# Patient Record
Sex: Female | Born: 2005 | Race: Black or African American | Hispanic: No | Marital: Single | Smoking: Never smoker
Health system: Southern US, Community
[De-identification: ages and names within clinical notes are randomized; demographics above are authoritative.]

## PROBLEM LIST (undated history)

## (undated) DIAGNOSIS — I499 Cardiac arrhythmia, unspecified: Secondary | ICD-10-CM

## (undated) HISTORY — PX: TONSILLECTOMY: SUR1361

---
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
## 2023-01-10 ENCOUNTER — Ambulatory Visit (INDEPENDENT_AMBULATORY_CARE_PROVIDER_SITE_OTHER): Payer: Medicaid Other | Admitting: Obstetrics & Gynecology

## 2023-01-10 ENCOUNTER — Encounter: Payer: Self-pay | Admitting: Obstetrics & Gynecology

## 2023-01-10 VITALS — BP 118/68 | HR 97 | Ht 62.0 in | Wt 128.0 lb

## 2023-01-10 DIAGNOSIS — N83201 Unspecified ovarian cyst, right side: Secondary | ICD-10-CM

## 2023-01-10 DIAGNOSIS — Z3046 Encounter for surveillance of implantable subdermal contraceptive: Secondary | ICD-10-CM

## 2023-01-10 DIAGNOSIS — Z30011 Encounter for initial prescription of contraceptive pills: Secondary | ICD-10-CM

## 2023-01-10 MED ORDER — DROSPIRENONE-ETHINYL ESTRADIOL 3-0.02 MG PO TABS
1.0000 | ORAL_TABLET | Freq: Every day | ORAL | 11 refills | Status: DC
Start: 2023-01-10 — End: 2024-05-06

## 2023-01-10 NOTE — Patient Instructions (Signed)
Nexplanon Instructions After Removal  Keep bandage clean and dry for 24 hours  May use ice/Tylenol/Ibuprofen for soreness or pain  If you develop fever, drainage or increased warmth from incision site-contact office immediately   

## 2023-01-10 NOTE — Progress Notes (Signed)
GYNECOLOGY OFFICE VISIT NOTE  History:   Teresa Lucero is a 17 y.o. G1P0 here today for evaluation after ED visit on 12/10/22 for ruptured hemorrhagic cyst.  2 cm right ovarian hemorrhagic cyst seen on imaging.  She is here with her mother.  No concerns about cyst, but wants Nexplanon removed. This was placed in 2022, she still has prolonged bleeding.  Wants to start OCPs to help with her menses and also help with ovarian cyst suppression. She denies any abnormal vaginal discharge,  pelvic pain or other concerns.    Past Medical History:  Diagnosis Date   Irregular heart beat     Past Surgical History:  Procedure Laterality Date   TONSILLECTOMY      The following portions of the patient's history were reviewed and updated as appropriate: allergies, current medications, past family history, past medical history, past social history, past surgical history and problem list.   Health Maintenance:  Never had HPV vaccine series  Review of Systems:  Pertinent items noted in HPI and remainder of comprehensive ROS otherwise negative.  Physical Exam:  BP 118/68   Pulse 97   Ht 5\' 2"  (1.575 m)   Wt 128 lb (58.1 kg)   LMP 11/28/2022 (Exact Date)   BMI 23.41 kg/m  CONSTITUTIONAL: Well-developed, well-nourished female in no acute distress.  HEENT:  Normocephalic, atraumatic. External right and left ear normal. No scleral icterus.  NECK: Normal range of motion, supple, no masses noted on observation SKIN: No rash noted. Not diaphoretic. No erythema. No pallor. MUSCULOSKELETAL: Normal range of motion. No edema noted. NEUROLOGIC: Alert and oriented to person, place, and time. Normal muscle tone coordination. No cranial nerve deficit noted. PSYCHIATRIC: Normal mood and affect. Normal behavior. Normal judgment and thought content. CARDIOVASCULAR: Normal heart rate noted RESPIRATORY: Effort and breath sounds normal, no problems with respiration noted ABDOMEN: No masses noted. No other overt  distention noted.   PELVIC: Deferred  Nexplanon Removal Patient identified, informed consent performed, consent signed.   Appropriate time out taken. Nexplanon site identified.  Area prepped in usual sterile fashon. One ml of 1% lidocaine was used to anesthetize the area at the distal end of the implant. A small stab incision was made right beside the implant on the distal portion.  The Nexplanon rod was grasped using hemostats and removed without difficulty.  There was minimal blood loss. There were no complications.  3 ml of 1% lidocaine was injected around the incision for post-procedure analgesia.  Steri-strips were applied over the small incision.  A pressure bandage was applied to reduce any bruising.  The patient tolerated the procedure well and was given post procedure instructions.  Patient is planning to use OCPs for contraception.  Imaging: US PELVIC COMPLETE W TRANSVAGINAL AND TORSION R/O  Result Date: 12/10/2022 CLINICAL DATA:  Pelvic pain EXAM: TRANSABDOMINAL AND TRANSVAGINAL ULTRASOUND OF PELVIS DOPPLER ULTRASOUND OF OVARIES TECHNIQUE: Both transabdominal and transvaginal ultrasound examinations of the pelvis were performed. Transabdominal technique was performed for global imaging of the pelvis including uterus, ovaries, adnexal regions, and pelvic cul-de-sac. It was necessary to proceed with endovaginal exam following the transabdominal exam to visualize the ovaries and endometrium. Color and duplex Doppler ultrasound was utilized to evaluate blood flow to the ovaries. COMPARISON:  CT abdomen and pelvis 12/10/2022 FINDINGS: Uterus Measurements: 5.7 x 4.2 x 4.3 cm = volume: 53 mL. Diffusely heterogeneous. Endometrium Thickness: 3.1 mm.  No focal abnormality visualized. Right ovary Measurements: 4.1 x 3.0 x 2.9 cm =  volume: 19 mL. Complex cyst with internal debris identified measuring 2.1 x 1.2 x 1.4 cm. Left ovary Measurements: 4.0 cm by 2.9 cm x 2.3 cm = volume: 14 mL. Normal appearance/no  adnexal mass. Pulsed Doppler evaluation of both ovaries demonstrates normal low-resistance arterial and venous waveforms. Other findings There is a moderate amount of free fluid within the pelvis. There is echogenic material within the cul-de-sac with internal vascular flow, indeterminate, measuring 8 x 2.1 x 1.8 cm. Unclear correlative finding on recent CT. IMPRESSION: 1. Moderate amount of free fluid within the pelvis. Echogenic material within the cul-de-sac with internal vascular flow, indeterminate, measuring 8 x 2.1 x 1.8 cm. Unclear correlative finding on recent CT. Differential diagnosis would include pedunculated fibroid, endometriosis implant, or other etiologies. 2. Complex cyst within the right ovary measuring 2.1 x 1.2 x 1.4 cm, likely hemorrhagic. No follow-up necessary. 3. No evidence of ovarian torsion. Pelvic pain please select correct "US Pelvis" template depending on combination of exams performed / being read (e.g. both, Doppler, etc). Electronically Signed   By: Darliss Cheney M.D.   On: 12/10/2022 15:30   CT ABDOMEN PELVIS W CONTRAST  Result Date: 12/10/2022 CLINICAL DATA:  Abdominal pain, acute, nonlocalized EXAM: CT ABDOMEN AND PELVIS WITH CONTRAST TECHNIQUE: Multidetector CT imaging of the abdomen and pelvis was performed using the standard protocol following bolus administration of intravenous contrast. RADIATION DOSE REDUCTION: This exam was performed according to the departmental dose-optimization program which includes automated exposure control, adjustment of the mA and/or kV according to patient size and/or use of iterative reconstruction technique. CONTRAST:  80mL OMNIPAQUE IOHEXOL 300 MG/ML  SOLN COMPARISON:  None Available. FINDINGS: Lower chest: No acute abnormality. Hepatobiliary: No suspicious focal lesion identified. Scattered benign cysts. Gallbladder is unremarkable. No intrahepatic or extrahepatic biliary ductal dilation. Portal vein is patent. Pancreas: Unremarkable. No  pancreatic ductal dilatation or surrounding inflammatory changes. Spleen: Normal in size without focal abnormality. Adrenals/Urinary Tract: Adrenal glands are unremarkable. Kidneys enhance symmetrically. No hydronephrosis. No obstructing nephrolithiasis. Bladder is completely decompressed, limiting evaluation. Stomach/Bowel: No evidence of bowel obstruction. Visualized portions of the appendiceal candidate are unremarkable. Portions are obscured by multiple closely adjacent bowel loops and lack of oral contrast. Stomach is unremarkable. Vascular/Lymphatic: No significant vascular findings are present. No enlarged abdominal or pelvic lymph nodes. Reproductive: Uterus and bilateral adnexa are unremarkable. Other: Small volume of free fluid in the pelvis. Musculoskeletal: LEFT-sided assimilation joint at L5-S1. IMPRESSION: 1. Small volume of free fluid in the pelvis is nonspecific and is favored physiologic. This could be due to rupture of an ovarian cyst. 2. Visualized portions of the appendiceal candidate are unremarkable. Portions are obscured by multiple closely adjacent bowel loops and lack of oral contrast. If persistent clinical concern for acute appendicitis, consider follow-up imaging. Electronically Signed   By: Meda Klinefelter M.D.   On: 12/10/2022 12:14       Assessment and Plan:    1. Cyst of right ovary Reviewed imaging, reassured patient about benign, physiologic nature of ovarian cyst. Will start on OCPs to help with suppression.  2. Initiation of OCP (BCP) Risks and benefits discussed in detail, all questions answered. Stressed that Nexplanon had more contraceptive efficacy, but patient desired OCPs. Will start Yaz today. Backup contraception recommended for first two weeks, condoms recommended for STI prevention. Wil do OCP check in about 2 months, told to call earlier for any concerns. - drospirenone-ethinyl estradiol (YAZ) 3-0.02 MG tablet; Take 1 tablet by mouth daily.  Dispense: 28  tablet; Refill: 11  3. Encounter for Nexplanon removal [Z30.46] This was successfully removed, OCPs started. \  Routine preventative health maintenance measures emphasized, counseled her and her mother about getting HPV vaccine.  They will consider this and let us know.  Please refer to After Visit Summary for other counseling recommendations.   Return in about 7 weeks (around 02/28/2023) for OCP check.    I spent 30 minutes dedicated to the care of this patient including pre-visit review of records, face to face time with the patient discussing her conditions and treatments and post visit orders.    Jaynie Collins, MD, FACOG Obstetrician & Gynecologist, [facility] for [employer], [facility]

## 2023-02-26 ENCOUNTER — Ambulatory Visit: Payer: Medicaid Other | Admitting: Obstetrics and Gynecology

## 2024-05-06 ENCOUNTER — Encounter (HOSPITAL_BASED_OUTPATIENT_CLINIC_OR_DEPARTMENT_OTHER): Payer: Self-pay | Admitting: Emergency Medicine

## 2024-05-06 ENCOUNTER — Encounter (HOSPITAL_COMMUNITY): Payer: Self-pay | Admitting: Psychiatry

## 2024-05-06 ENCOUNTER — Emergency Department (HOSPITAL_BASED_OUTPATIENT_CLINIC_OR_DEPARTMENT_OTHER)

## 2024-05-06 ENCOUNTER — Inpatient Hospital Stay (HOSPITAL_COMMUNITY)
Admission: AD | Admit: 2024-05-06 | Discharge: 2024-05-10 | DRG: 885 | Disposition: A | Discharge status: Home / Self Care | Source: Intra-hospital

## 2024-05-06 ENCOUNTER — Other Ambulatory Visit: Payer: Self-pay

## 2024-05-06 ENCOUNTER — Emergency Department (INDEPENDENT_AMBULATORY_CARE_PROVIDER_SITE_OTHER)
Admission: EM | Admit: 2024-05-06 | Discharge: 2024-05-06 | Disposition: A | Discharge status: Other Acute Inpatient Hospital | Source: Home / Self Care

## 2024-05-06 DIAGNOSIS — R011 Cardiac murmur, unspecified: Secondary | ICD-10-CM | POA: Insufficient documentation

## 2024-05-06 DIAGNOSIS — Z62810 Personal history of physical and sexual abuse in childhood: Secondary | ICD-10-CM

## 2024-05-06 DIAGNOSIS — Z9152 Personal history of nonsuicidal self-harm: Secondary | ICD-10-CM | POA: Diagnosis not present

## 2024-05-06 DIAGNOSIS — F32A Depression, unspecified: Secondary | ICD-10-CM | POA: Insufficient documentation

## 2024-05-06 DIAGNOSIS — F4310 Post-traumatic stress disorder, unspecified: Secondary | ICD-10-CM | POA: Diagnosis present

## 2024-05-06 DIAGNOSIS — R45851 Suicidal ideations: Secondary | ICD-10-CM | POA: Diagnosis present

## 2024-05-06 DIAGNOSIS — F332 Major depressive disorder, recurrent severe without psychotic features: Secondary | ICD-10-CM

## 2024-05-06 DIAGNOSIS — Z79899 Other long term (current) drug therapy: Secondary | ICD-10-CM | POA: Diagnosis not present

## 2024-05-06 DIAGNOSIS — D649 Anemia, unspecified: Secondary | ICD-10-CM | POA: Diagnosis present

## 2024-05-06 DIAGNOSIS — E559 Vitamin D deficiency, unspecified: Secondary | ICD-10-CM | POA: Diagnosis present

## 2024-05-06 DIAGNOSIS — G4700 Insomnia, unspecified: Secondary | ICD-10-CM | POA: Diagnosis present

## 2024-05-06 DIAGNOSIS — Z91410 Personal history of adult physical and sexual abuse: Secondary | ICD-10-CM | POA: Diagnosis not present

## 2024-05-06 DIAGNOSIS — F411 Generalized anxiety disorder: Secondary | ICD-10-CM | POA: Diagnosis present

## 2024-05-06 DIAGNOSIS — Z639 Problem related to primary support group, unspecified: Secondary | ICD-10-CM

## 2024-05-06 DIAGNOSIS — Z8249 Family history of ischemic heart disease and other diseases of the circulatory system: Secondary | ICD-10-CM

## 2024-05-06 DIAGNOSIS — F410 Panic disorder [episodic paroxysmal anxiety] without agoraphobia: Secondary | ICD-10-CM | POA: Diagnosis present

## 2024-05-06 DIAGNOSIS — R0789 Other chest pain: Secondary | ICD-10-CM | POA: Insufficient documentation

## 2024-05-06 DIAGNOSIS — Z9151 Personal history of suicidal behavior: Secondary | ICD-10-CM

## 2024-05-06 DIAGNOSIS — Z91411 Personal history of adult psychological abuse: Secondary | ICD-10-CM | POA: Diagnosis not present

## 2024-05-06 LAB — COMPREHENSIVE METABOLIC PANEL WITH GFR
ALT: 14 U/L (ref 0–44)
AST: 17 U/L (ref 15–41)
Albumin: 4.5 g/dL (ref 3.5–5.0)
Alkaline Phosphatase: 52 U/L (ref 38–126)
Anion gap: 13 (ref 5–15)
BUN: 12 mg/dL (ref 6–20)
CO2: 23 mmol/L (ref 22–32)
Calcium: 9.5 mg/dL (ref 8.9–10.3)
Chloride: 103 mmol/L (ref 98–111)
Creatinine, Ser: 0.68 mg/dL (ref 0.44–1.00)
GFR, Estimated: >60 mL/min (ref >60)
Glucose, Bld: 90 mg/dL (ref 70–99)
Potassium: 3.4 mmol/L — ABNORMAL LOW (ref 3.5–5.1)
Sodium: 140 mmol/L (ref 135–145)
Total Bilirubin: 0.3 mg/dL (ref 0.0–1.2)
Total Protein: 7.6 g/dL (ref 6.5–8.1)

## 2024-05-06 LAB — CBC WITH DIFFERENTIAL/PLATELET
Abs Immature Granulocytes: 0.03 K/uL (ref 0.00–0.07)
Basophils Absolute: 0 K/uL (ref 0.0–0.1)
Basophils Relative: 0 %
Eosinophils Absolute: 0 K/uL (ref 0.0–0.5)
Eosinophils Relative: 0 %
HCT: 26.5 % — ABNORMAL LOW (ref 36.0–46.0)
Hemoglobin: 7.3 g/dL — ABNORMAL LOW (ref 12.0–15.0)
Immature Granulocytes: 0 %
Lymphocytes Relative: 13 %
Lymphs Abs: 1.4 K/uL (ref 0.7–4.0)
MCH: 15 pg — ABNORMAL LOW (ref 26.0–34.0)
MCHC: 27.5 g/dL — ABNORMAL LOW (ref 30.0–36.0)
MCV: 54.5 fL — ABNORMAL LOW (ref 80.0–100.0)
Monocytes Absolute: 1.2 K/uL — ABNORMAL HIGH (ref 0.1–1.0)
Monocytes Relative: 11 %
Neutro Abs: 7.7 K/uL (ref 1.7–7.7)
Neutrophils Relative %: 76 %
Platelets: 503 K/uL — ABNORMAL HIGH (ref 150–400)
RBC: 4.86 MIL/uL (ref 3.87–5.11)
RDW: 22.5 % — ABNORMAL HIGH (ref 11.5–15.5)
WBC: 10.3 K/uL (ref 4.0–10.5)
nRBC: 0 % (ref 0.0–0.2)

## 2024-05-06 LAB — TROPONIN T, HIGH SENSITIVITY
Troponin T High Sensitivity: <15 ng/L (ref 0–19)
Troponin T High Sensitivity: <15 ng/L (ref 0–19)

## 2024-05-06 LAB — HCG, SERUM, QUALITATIVE: Preg, Serum: NEGATIVE

## 2024-05-06 LAB — URINE DRUG SCREEN
Amphetamines: NEGATIVE
Barbiturates: NEGATIVE
Benzodiazepines: NEGATIVE
Cocaine: NEGATIVE
Fentanyl: NEGATIVE
Methadone Scn, Ur: NEGATIVE
Opiates: NEGATIVE
Tetrahydrocannabinol: POSITIVE — AB

## 2024-05-06 LAB — D-DIMER, QUANTITATIVE: D-Dimer, Quant: 0.67 ug{FEU}/mL — ABNORMAL HIGH (ref 0.00–0.50)

## 2024-05-06 LAB — ETHANOL: Alcohol, Ethyl (B): <15 mg/dL (ref <15)

## 2024-05-06 MED ORDER — LORAZEPAM 2 MG/ML IJ SOLN: 2.0000 mg | Freq: Three times a day (TID) | INTRAMUSCULAR | Status: DC | PRN

## 2024-05-06 MED ORDER — HALOPERIDOL LACTATE 5 MG/ML IJ SOLN: 10.0000 mg | Freq: Three times a day (TID) | INTRAMUSCULAR | Status: DC | PRN

## 2024-05-06 MED ORDER — MAGNESIUM HYDROXIDE 400 MG/5ML PO SUSP: 30.0000 mL | Freq: Every day | ORAL | Status: DC | PRN

## 2024-05-06 MED ORDER — DROSPIRENONE-ETHINYL ESTRADIOL 3-0.02 MG PO TABS
1.0000 | ORAL_TABLET | Freq: Every day | ORAL | Status: DC
Start: 2024-05-06 — End: 2024-05-07

## 2024-05-06 MED ORDER — LORAZEPAM 1 MG PO TABS
0.5000 mg | ORAL_TABLET | Freq: Once | ORAL | Status: AC
  Administered 2024-05-06: 0.5 mg via ORAL
  Filled 2024-05-06: qty 1

## 2024-05-06 MED ORDER — HYDROXYZINE HCL 25 MG PO TABS
25.0000 mg | ORAL_TABLET | Freq: Three times a day (TID) | ORAL | Status: DC | PRN
  Administered 2024-05-06 – 2024-05-10 (×3): 25 mg via ORAL
  Filled 2024-05-06 (×3): qty 1

## 2024-05-06 MED ORDER — DIPHENHYDRAMINE HCL 25 MG PO CAPS: 50.0000 mg | ORAL_CAPSULE | Freq: Three times a day (TID) | ORAL | Status: DC | PRN

## 2024-05-06 MED ORDER — DIPHENHYDRAMINE HCL 50 MG/ML IJ SOLN: 50.0000 mg | Freq: Three times a day (TID) | INTRAMUSCULAR | Status: DC | PRN

## 2024-05-06 MED ORDER — HALOPERIDOL LACTATE 5 MG/ML IJ SOLN: 5.0000 mg | Freq: Three times a day (TID) | INTRAMUSCULAR | Status: DC | PRN

## 2024-05-06 MED ORDER — HALOPERIDOL 5 MG PO TABS: 5.0000 mg | ORAL_TABLET | Freq: Three times a day (TID) | ORAL | Status: DC | PRN

## 2024-05-06 MED ORDER — TRAZODONE HCL 50 MG PO TABS
50.0000 mg | ORAL_TABLET | Freq: Every evening | ORAL | Status: DC | PRN
  Administered 2024-05-06 – 2024-05-09 (×4): 50 mg via ORAL
  Filled 2024-05-06 (×4): qty 1

## 2024-05-06 MED ORDER — ACETAMINOPHEN 325 MG PO TABS
650.0000 mg | ORAL_TABLET | Freq: Four times a day (QID) | ORAL | Status: DC | PRN
  Administered 2024-05-06: 650 mg via ORAL
  Filled 2024-05-06: qty 2

## 2024-05-06 MED ORDER — HYDROXYZINE HCL 25 MG PO TABS
25.0000 mg | ORAL_TABLET | Freq: Three times a day (TID) | ORAL | Status: DC | PRN
  Administered 2024-05-06: 25 mg via ORAL
  Filled 2024-05-06: qty 1

## 2024-05-06 MED ORDER — ALUM & MAG HYDROXIDE-SIMETH 200-200-20 MG/5ML PO SUSP: 30.0000 mL | ORAL | Status: DC | PRN

## 2024-05-06 MED ORDER — IOHEXOL 350 MG/ML SOLN
100.0000 mL | Freq: Once | INTRAVENOUS | Status: AC | PRN
  Administered 2024-05-06: 80 mL via INTRAVENOUS

## 2024-05-06 NOTE — ED Provider Notes (Signed)
 [place] [facility] Provider Note   CSN: [id number] Arrival date & time: 05/06/24  0543     Patient presents with: Chest Pain and Suicidal   Teresa Lucero is a 18 y.o. female.   The history is provided by the patient.  Chest Pain Teresa Lucero is a 18 y.o. female who presents to the Emergency Department complaining of chest pain. She presents the emergency department for evaluation of left sided chest pain that is described as constant in nature with an occasional sharp in shooting feeling. Symptoms began yesterday. No associated fever, cough, pain on breathing, leg swelling or pain. Nothing alleviates or worsened her pain. She does feel like it's hard to breathe at time. She also reports suicidal ideation. She is unwilling to provide more details about this. She does report having severe depression and being under a lot of stress. No personal or family history of blood clots. No tobacco, alcohol, drug use. Mother has a history of coronary artery disease in her 4s.       Prior to Admission medications   Medication Sig Start Date End Date Taking? Authorizing Provider  drospirenone -ethinyl estradiol  (YAZ) 3-0.02 MG tablet Take 1 tablet by mouth daily. 01/10/23   Anyanwu, Ugonna A, MD  ferrous sulfate  325 (65 FE) MG tablet Take 1 tablet (325 mg total) by mouth daily. Patient not taking: Reported on 02/01/2021 08/09/18   Angelena Smalls, MD  naproxen  (NAPROSYN ) 375 MG tablet Take 1 tablet (375 mg total) by mouth 2 (two) times daily with a meal. Patient not taking: Reported on 01/10/2023 12/10/22   Harris, Abigail, PA-C  ondansetron  (ZOFRAN  ODT) 4 MG disintegrating tablet Take 1 tablet (4 mg total) by mouth every 8 (eight) hours as needed for nausea or vomiting. Patient not taking: Reported on 02/01/2021 02/21/19   Griselda Norris, MD  traMADol  (ULTRAM ) 50 MG tablet Take 1 tablet (50 mg total) by mouth every 6 (six) hours as needed. Patient not taking:  Reported on 01/10/2023 12/10/22   Harris, Abigail, PA-C    Allergies: Patient has no known allergies.    Review of Systems  Cardiovascular:  Positive for chest pain.  All other systems reviewed and are negative.   Updated Vital Signs BP 115/70 (BP Location: Right Arm)   Pulse 90   Temp 98.8 F (37.1 C) (Oral)   Resp 20   Ht 5' 2 (1.575 m)   Wt 54.4 kg   LMP 04/30/2024   SpO2 100%   BMI 21.95 kg/m   Physical Exam Vitals and nursing note reviewed.  Constitutional:      Appearance: She is well-developed.  HENT:     Head: Normocephalic and atraumatic.  Cardiovascular:     Rate and Rhythm: Normal rate and regular rhythm.     Heart sounds: Murmur heard.  Pulmonary:     Effort: Pulmonary effort is normal. No respiratory distress.     Breath sounds: Normal breath sounds.  Abdominal:     Palpations: Abdomen is soft.     Tenderness: There is no abdominal tenderness. There is no guarding or rebound.  Musculoskeletal:        General: No swelling or tenderness.  Skin:    General: Skin is warm and dry.  Neurological:     Mental Status: She is alert and oriented to person, place, and time.  Psychiatric:     Comments: tearful     (all labs ordered are listed, but only abnormal results are displayed)  Labs Reviewed  COMPREHENSIVE METABOLIC PANEL WITH GFR - Abnormal; Notable for the following components:      Result Value   Potassium 3.4 (*)    All other components within normal limits  CBC WITH DIFFERENTIAL/PLATELET - Abnormal; Notable for the following components:   Hemoglobin 7.3 (*)    HCT 26.5 (*)    MCV 54.5 (*)    MCH 15.0 (*)    MCHC 27.5 (*)    RDW 22.5 (*)    Platelets 503 (*)    Monocytes Absolute 1.2 (*)    All other components within normal limits  D-DIMER, QUANTITATIVE - Abnormal; Notable for the following components:   D-Dimer, Quant 0.67 (*)    All other components within normal limits  ETHANOL  HCG, SERUM, QUALITATIVE  URINE DRUG SCREEN  TROPONIN T,  HIGH SENSITIVITY    EKG: EKG Interpretation Date/Time:  Tuesday May 06 2024 05:55:33 EDT Ventricular Rate:  84 PR Interval:  133 QRS Duration:  80 QT Interval:  364 QTC Calculation: 431 R Axis:   79  Text Interpretation: Sinus arrhythmia Confirmed by Griselda Norris [id number] on 05/06/2024 6:12:03 AM  Radiology: No results found.   Procedures   Medications Ordered in the ED  LORazepam  (ATIVAN ) tablet 0.5 mg (0.5 mg Oral Given 05/06/24 9376)                                    Medical Decision Making Amount and/or Complexity of Data Reviewed Labs: ordered. Radiology: ordered.  Risk Prescription drug management.   Patient here for evaluation of left sided chest pain for 24 hours. EKG is not ischemic. She also does report SI on triage. She is tearful on evaluation, refuses to reveal additional details but does report feeling severe depression. She will not comment when asked on suicidality or plan. CBC is significant for anemia. LMP was last month, no hematochezia or melena. Patient does have a history of anemia and is not currently on iron supplements. She does not meet transfusion threshold. She should start iron supplementation regarding her anemia and follow-up with PCP for recheck. Troponin is negative. Presentation is not consistent with ACS, patient with 24 hours a symptoms. Patient care transferred pending CTA to rule out PE. In terms of patient suicidal thoughts TTS consulted with placed.     Final diagnoses:  None    ED Discharge Orders     None          Griselda Norris, MD 05/06/24 0700

## 2024-05-06 NOTE — Tx Team (Signed)
 Initial Treatment Plan 05/06/2024 2:58 PM Sherrilee Ned FMW:[id number]    PATIENT STRESSORS: Loss of father     PATIENT STRENGTHS: Ability for insight  Capable of independent living  Physical Health  Supportive family/friends  Work skills    PATIENT IDENTIFIED PROBLEMS:   Anger   SI  Depression  Anxiety             DISCHARGE CRITERIA:  Ability to meet basic life and health needs Improved stabilization in mood, thinking, and/or behavior Verbal commitment to aftercare and medication compliance  PRELIMINARY DISCHARGE PLAN: Return to previous living arrangement  PATIENT/FAMILY INVOLVEMENT: This treatment plan has been presented to and reviewed with the patient, Teresa Lucero,  The patient and family have been given the opportunity to ask questions and make suggestions.  Eleanor KATHEE Flemings, RN 05/06/2024, 2:58 PM

## 2024-05-06 NOTE — ED Notes (Addendum)
 Report to [facility] , pt had expressed earlier that she wanted to go home, mother was with her, pt stating that she was worried about losing her job  pt spoke to provider  again with her mother

## 2024-05-06 NOTE — ED Notes (Signed)
Transport called for

## 2024-05-06 NOTE — ED Notes (Signed)
 Safe transport called to arrange transportation to [facility]

## 2024-05-06 NOTE — ED Notes (Addendum)
 report rec'd from prev RN

## 2024-05-06 NOTE — ED Notes (Signed)
 Visitor: Patients mother ( security wanded and cell phone and keys at charge desk)

## 2024-05-06 NOTE — BH Assessment (Signed)
 Patient was referred to [facility] for [facility] assessment. Chat was started for [facility], ED providers and TTS for communication purposes. Call placed to Charge RN, Allie, to advise.   Dannon Perlow T. Aura, MS, [facility], [facility] Triage Specialist [facility]

## 2024-05-06 NOTE — Plan of Care (Signed)

## 2024-05-06 NOTE — ED Provider Notes (Signed)
 I took over care of the patient at 7 AM pending CTA chest and psychiatry consult.  Physical Exam  BP 113/63   Pulse 69   Temp 98.8 F (37.1 C)   Resp 16   Ht 5' 2 (1.575 m)   Wt 54.4 kg   LMP 04/30/2024   SpO2 99%   BMI 21.95 kg/m   Physical Exam Vitals and nursing note reviewed.  Constitutional:      General: She is not in acute distress.    Appearance: She is well-developed.  HENT:     Head: Normocephalic and atraumatic.  Eyes:     Conjunctiva/sclera: Conjunctivae normal.  Cardiovascular:     Rate and Rhythm: Normal rate and regular rhythm.     Heart sounds: No murmur heard. Pulmonary:     Effort: Pulmonary effort is normal. No respiratory distress.     Breath sounds: Normal breath sounds.  Abdominal:     Palpations: Abdomen is soft.     Tenderness: There is no abdominal tenderness.  Musculoskeletal:        General: No swelling.     Cervical back: Neck supple.  Skin:    General: Skin is warm and dry.     Capillary Refill: Capillary refill takes less than 2 seconds.  Neurological:     Mental Status: She is alert.  Psychiatric:        Mood and Affect: Mood normal.     Procedures  Procedures  ED Course / MDM    Medical Decision Making Patient CT angiogram of her chest is negative for PE or acute abnormality telepsych saw the patient and recommended inpatient behavioral health.  Patient and mom at bedside updated and agreeable with this plan.  We are waiting for bed assignment at this time.  Problems Addressed: Atypical chest pain: acute illness or injury Suicidal ideation: undiagnosed new problem with uncertain prognosis  Amount and/or Complexity of Data Reviewed Labs: ordered. Decision-making details documented in ED Course. Radiology: ordered and independent interpretation performed. Decision-making details documented in ED Course.  Risk OTC drugs. Prescription drug management. Decision regarding hospitalization. Diagnosis or treatment significantly  limited by social determinants of health.          Gennaro Duwaine CROME, DO 05/06/24 [phone]

## 2024-05-06 NOTE — ED Notes (Signed)
 Per psych, recommending inpatient BH. After speaking with provider, will initiate suicide precautions. Patient moved to [facility] room, all potentially harmful items removed from room. Sitter at bedside. Patient changed into paper scrubs

## 2024-05-06 NOTE — Progress Notes (Signed)
 Pt admitted Voluntary for passive SI no plan and verbally contract for safety. Endorse depression 7/10 and anxiety 5/10. Denies AVH. Pt appears flat and depressed. Pt was calm and cooperative with admission process. Reports hx of 2 suicide attempts in the past and history of cutting her arms. Pt reports she has not cut herself since she was 16. Pt reports she wants to work on her anger and learning how to not let her past ruin her future. PT searched and no contraband found, POC and unit policies explained and understanding verbalized. Consents obtained. Food and fluids offered, Pt had no additional questions or concerns. This Clinical research associate completed the admission process and reported off to [place] the primary nurse for this patient.

## 2024-05-06 NOTE — ED Triage Notes (Signed)
 Chest  tightness, with pain that comes and goes. X 1 day.

## 2024-05-06 NOTE — Group Note (Signed)
 Date:  05/06/2024 Time:  3:58 PM  Group Topic/Focus: Discuss methods to help fall asleep and wake up Self Care:   The focus of this group is to help patients understand the importance of self-care in order to improve or restore emotional, physical, spiritual, interpersonal, and financial health.    Participation Level:  Did Not Attend    Shanda JONETTA Challenger 05/06/2024, 3:58 PM

## 2024-05-06 NOTE — Consult Note (Signed)
 [facility] Psychiatric Consult Initial  Patient Name: .Teresa Lucero  MRN: [id number]  DOB: 2005-09-07  Consult Order details:  Orders (From admission, onward)     Start     Ordered   05/06/24 0605  CONSULT TO CALL ACT TEAM       Ordering Provider: Griselda Norris, MD  Provider:  (Not yet assigned)  Question:  Reason for Consult?  Answer:  SI   05/06/24 0604             Mode of Visit: Tele-visit Virtual Statement:TELE PSYCHIATRY ATTESTATION & CONSENT As the provider for this telehealth consult, I attest that I verified the patient's identity using two separate identifiers, introduced myself to the patient, provided my credentials, disclosed my location, and performed this encounter via a HIPAA-compliant, real-time, face-to-face, two-way, interactive audio and video platform and with the full consent and agreement of the patient (or guardian as applicable.) Patient physical location: [facility]. Telehealth provider physical location: home office in state of [place].   Video start time: 0920 Video end time: 1000    Psychiatry Consult Evaluation  Service Date: May 06, 2024 LOS:  LOS: 0 days  Chief Complaint Patient presented to the ED with chest pain and tightness, evaluated for medical causes. During evaluation, endorsed significant depression and current suicidal ideation without specific plan.  Primary Psychiatric Diagnoses  Depressive disorder 2.  Generalized anxiety disorder  Assessment  Teresa Lucero is a 18 y.o. female admitted: Presented to the EDfor 05/06/2024  5:46 AM presented to the ED with chest pain and tightness, evaluated for medical causes. During evaluation, endorsed significant depression and current suicidal ideation without specific plan. She carries the psychiatric diagnoses of depression and anxiety and has a past medical history of heart murmur.   18 year old female with depression since adolescence, recent worsening of symptoms, past suicide attempt and history of  self-harm, now presenting with active suicidal ideation and no protective factors identified. Current psychosocial stressors include unresolved grief (father's death, miscarriage) and poor coping skills. PHQ-9 score consistent with moderate depression. Given her history of suicide attempt, current suicidal thoughts, and functional impairment, she remains at high risk for self-harm if discharged. Please see plan below for detailed recommendations.   Diagnoses:  Active Hospital problems: Principal Problem:   MDD (major depressive disorder), recurrent severe, without psychosis (HCC) Active Problems:   Generalized anxiety disorder    Plan   ## Psychiatric Medication Recommendations:  Give Atarax  25 mg p.o. 3 times daily as needed for anxiety  ## Medical Decision Making Capacity: Not specifically addressed in this encounter  ## Further Work-up:  -- No further workup needed at this time EKG or UDS -- most recent EKG on 05/06/2024 had QtC of 431 -- Pertinent labwork reviewed earlier this admission includes: CBC, EKG, UDS, CMP   ## Disposition:-- We recommend inpatient psychiatric hospitalization when medically cleared. Patient is under voluntary admission status at this time; please IVC if attempts to leave hospital.  ## Behavioral / Environmental: -To minimize splitting of staff, assign one staff person to communicate all information from the team when feasible. or Utilize compassion and acknowledge the patient's experiences while setting clear and realistic expectations for care.    ## Safety and Observation Level:  - Based on my clinical evaluation, I estimate the patient to be at moderate risk of self harm in the current setting. - At this time, we recommend  1:1 Observation. This decision is based on my review of the chart including patient's history and  current presentation, interview of the patient, mental status examination, and consideration of suicide risk including evaluating  suicidal ideation, plan, intent, suicidal or self-harm behaviors, risk factors, and protective factors. This judgment is based on our ability to directly address suicide risk, implement suicide prevention strategies, and develop a safety plan while the patient is in the clinical setting. Please contact our team if there is a concern that risk level has changed.  CSSR Risk Category:C-SSRS RISK CATEGORY: High Risk  Suicide Risk Assessment: Patient has following modifiable risk factors for suicide: active suicidal ideation, untreated depression, lack of access to outpatient mental health resources, and current symptoms: anxiety/panic, insomnia, impulsivity, anhedonia, hopelessness, which we are addressing by recommending inpatient psychiatric admission. Patient has following non-modifiable or demographic risk factors for suicide: history of suicide attempt and history of self harm behavior Patient has the following protective factors against suicide: Supportive family and Supportive friends  Thank you for this consult request. Recommendations have been communicated to the primary team.  We will continue to follow patient at this time.   CATHALEEN ADAM, PMHNP       History of Present Illness  Relevant Aspects of Hospital ED Course:  Admitted on 05/06/2024 presented to the ED with chest pain and tightness, evaluated for medical causes. During evaluation, endorsed significant depression and current suicidal ideation without specific plan.  Patient Report:  Teresa Lucero, 18 y.o., female patient seen face to face by this provider, consulted with Dr. Larina; and chart reviewed on 05/06/24.  On evaluation Teresa Lucero reports chest pain started yesterday, worsened this morning; describes as constant with occasional sharp/shooting sensations. Denies fever, cough, leg swelling, or pleuritic pain. Reports longstanding depression since adolescence, worsened over past 2 weeks, unable to identify clear  triggers. Endorses depressive symptoms: anhedonia, low mood, hopelessness, fatigue, poor appetite, sleep disturbance, psychomotor slowing, low self-worth. PHQ-9 score: 13 (moderate depression). Admits to suicidal ideation currently, though unwilling to provide details; denies active plan. Past overdose attempt (pills) ~1 year ago; informed mother but did not seek medical attention. Past history of self-harming behavior, last incident ~2-3 years ago. Denies HI, AVH.  During evaluation Teresa Lucero is casually dressed, appears stated age, cooperative. Mood/Affect: "Depressed"; affect constricted, congruent. Speech: Normal rate and volume. Thought Process: Linear, goal-directed. Thought Content: Endorses SI without plan, denies HI/AVH. No delusions elicited. Orientation: Alert and oriented 4. Insight/Judgment: Limited; acknowledges depression and suicidal thoughts but resistant to elaborating further.    Psych ROS:  Depression: Endorses Anxiety: Endorses Mania (lifetime and current): Denies Psychosis: (lifetime and current): Denies  Collateral information:  Contacted and attempted to call patient's mother no answer  Review of Systems  Psychiatric/Behavioral:  Positive for depression and suicidal ideas.      Psychiatric and Social History  Psychiatric History:  Information collected from patient and chart review  Prev Dx/Sx: Depression Current Psych Provider: None Home Meds (current): None Previous Med Trials: None Therapy: None  Prior Psych Hospitalization: Denies Prior Self Harm: Yes Prior Violence: Denies  Family Psych History: Yes Family Hx suicide: denies  Social History:  Developmental Hx: Deferred Educational Hx: During a high school Occupational Hx: Works Armed forces operational officer Hx: Denies Living Situation: Lives with boyfriend Spiritual Hx: Yes Access to weapons/lethal means: Denies  Substance History Patient denies any current or past substance abuse use  Exam Findings   Physical Exam:  Vital Signs:  Temp:  [98.8 F (37.1 C)] 98.8 F (37.1 C) (09/02 0900) Pulse Rate:  [69-90] 69 (09/02 0900) Resp:  [16-20]  16 (09/02 0900) BP: (113-115)/(63-70) 113/63 (09/02 0900) SpO2:  [99 %-100 %] 99 % (09/02 0900) Weight:  [54.4 kg] 54.4 kg (09/02 0549) Blood pressure 113/63, pulse 69, temperature 98.8 F (37.1 C), resp. rate 16, height 5' 2 (1.575 m), weight 54.4 kg, last menstrual period 04/30/2024, SpO2 99%. Body mass index is 21.95 kg/m.  Physical Exam Vitals and nursing note reviewed.  Neurological:     Mental Status: She is alert.  Psychiatric:        Attention and Perception: Attention normal.        Mood and Affect: Mood is depressed. Affect is flat.        Speech: Speech normal.        Behavior: Behavior is cooperative.        Thought Content: Thought content includes suicidal ideation.        Judgment: Judgment is inappropriate.     Mental Status Exam: General Appearance: Casually dressed, appears stated age, cooperative.  Orientation:  Full (Time, Place, and Person)  Memory:  Immediate;   Fair Recent;   Fair  Concentration:  Concentration: Fair  Recall:  Fair  Attention  Fair  Eye Contact:  Fair  Speech:  Clear and Coherent  Language:  Good  Volume:  Normal  Mood: "Depressed"  Affect:  Congruent  Thought Process:  Goal Directed and Linear  Thought Content:  WDL  Suicidal Thoughts:  Yes.  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  Family History:  The patient's family history includes Migraines in her maternal aunt.  Medical  History: Past Medical History:  Diagnosis Date   Irregular heart beat     Surgical History: Past Surgical History:  Procedure Laterality Date   TONSILLECTOMY       Medications:  No current facility-administered medications for this encounter.  Current Outpatient Medications:    drospirenone -ethinyl estradiol  (YAZ) 3-0.02 MG tablet, Take 1 tablet by mouth daily., Disp: 28 tablet, Rfl: 11   ferrous sulfate  325 (65 FE) MG tablet, Take 1 tablet (325 mg total) by mouth daily. (Patient not taking: Reported on 02/01/2021), Disp: 30 tablet, Rfl: 0   naproxen  (NAPROSYN ) 375 MG tablet, Take 1 tablet (375 mg total) by mouth 2 (two) times daily with a meal. (Patient not taking: Reported on 01/10/2023), Disp: 20 tablet, Rfl: 0   ondansetron  (ZOFRAN  ODT) 4 MG disintegrating tablet, Take 1 tablet (4 mg total) by mouth every 8 (eight) hours as needed for nausea or vomiting. (Patient not taking: Reported on 02/01/2021), Disp: 8 tablet, Rfl: 0   traMADol  (ULTRAM ) 50 MG tablet, Take 1 tablet (50 mg total) by mouth every 6 (six) hours as needed. (Patient not taking: Reported on 01/10/2023), Disp: 15 tablet, Rfl: 0  Allergies: No Known Allergies  Mylan Lengyel MOTLEY-MANGRUM, PMHNP

## 2024-05-06 NOTE — ED Notes (Signed)
 ED SI bundle on hold per EDP.

## 2024-05-06 NOTE — [facility] Group Notes (Signed)
 [facility] Group Notes:  (Nursing/MHT/Case Management/Adjunct)  Date:  05/06/2024  Time:  9:36 PM  Type of Therapy:  Wrap-up group  Participation Level:  Active  Participation Quality:  Appropriate  Affect:  Appropriate  Cognitive:  Appropriate  Insight:  Appropriate  Engagement in Group:  Engaged  Modes of Intervention:  Education  Summary of Progress/Problems: Goal to stay positive. Rated day 6/10.  Teresa Lucero 05/06/2024, 9:36 PM

## 2024-05-06 NOTE — ED Notes (Signed)
Security wanded the patient.

## 2024-05-06 NOTE — ED Notes (Signed)
 Patient transported to X-ray

## 2024-05-06 NOTE — Progress Notes (Signed)
 Pt has been accepted to [facility] on 05/06/2024 Bed assignment: 305-01  Pt meets inpatient criteria per: Cathaleen Jacobson NP  Attending Physician will be: Dr. Prentis    Report can be called to: Adult unit: [phone]  Pt can arrive after [facility] will update   Care Team Notified: [facility]  Cherylynn Ernst RN, Cathaleen Jacobson NP,   [place] [employer] LCSW-A   05/06/2024 10:17 AM

## 2024-05-07 ENCOUNTER — Encounter (HOSPITAL_COMMUNITY): Payer: Self-pay

## 2024-05-07 DIAGNOSIS — F332 Major depressive disorder, recurrent severe without psychotic features: Principal | ICD-10-CM

## 2024-05-07 LAB — TSH: TSH: 1.17 u[IU]/mL (ref 0.350–4.500)

## 2024-05-07 MED ORDER — FERROUS SULFATE 325 (65 FE) MG PO TABS
325.0000 mg | ORAL_TABLET | Freq: Every day | ORAL | Status: DC
  Administered 2024-05-08 – 2024-05-10 (×3): 325 mg via ORAL
  Filled 2024-05-07 (×3): qty 1

## 2024-05-07 MED ORDER — SERTRALINE HCL 50 MG PO TABS
50.0000 mg | ORAL_TABLET | Freq: Every day | ORAL | Status: DC
Start: 2024-05-08 — End: 2024-05-10
  Administered 2024-05-08 – 2024-05-10 (×3): 50 mg via ORAL
  Filled 2024-05-07 (×3): qty 1

## 2024-05-07 MED ORDER — SERTRALINE HCL 25 MG PO TABS
25.0000 mg | ORAL_TABLET | Freq: Every day | ORAL | Status: AC
  Administered 2024-05-07: 25 mg via ORAL
  Filled 2024-05-07: qty 1

## 2024-05-07 NOTE — Group Note (Signed)
 Recreation Therapy Group Note   Group Topic:Problem Solving  Group Date: 05/07/2024 Start Time: 0935 End Time: 1005 Facilitators: Kallee Nam-McCall, LRT,CTRS Location: [address] Dayroom   Group Topic: Communication, Team Building, Problem Solving  Goal Area(s) Addresses:  Patient will effectively work with peer towards shared goal.  Patient will identify skills used to make activity successful.  Patient will identify how skills used during activity can be used to reach post d/c goals.   Behavioral Response: Engaged  Intervention: STEM Activity  Activity: Stage manager. In teams of 3-5, patients were given 12 plastic drinking straws and an equal length of masking tape. Using the materials provided, patients were asked to build a landing pad to catch a golf ball dropped from approximately 5 feet in the air. All materials were required to be used by the team in their design. LRT facilitated post-activity discussion.  Education: Pharmacist, community, Scientist, physiological, Discharge Planning   Education Outcome: Acknowledges education/In group clarification offered/Needs additional education.    Affect/Mood: Appropriate   Participation Level: Engaged   Participation Quality: Independent   Behavior: Appropriate   Speech/Thought Process: Focused   Insight: Good   Judgement: Good   Modes of Intervention: STEM Activity   Patient Response to Interventions:  Engaged   Education Outcome:  In group clarification offered    Clinical Observations/Individualized Feedback: Pt was focused and engaged throughout group. Pt offered suggestions as well as physically involved in putting structure together.      Plan: Continue to engage patient in RT group sessions 2-3x/week.   Asiana Benninger-McCall, LRT,CTRS  05/07/2024 12:30 PM

## 2024-05-07 NOTE — BH IP Treatment Plan (Signed)
 Interdisciplinary Treatment and Diagnostic Plan Update  05/07/2024 Time of Session: 10:50 AM Teresa Lucero MRN: [id number]  Principal Diagnosis: MDD (major depressive disorder), recurrent severe, without psychosis (HCC)  Secondary Diagnoses: Principal Problem:   MDD (major depressive disorder), recurrent severe, without psychosis (HCC)   Current Medications:  Current Facility-Administered Medications  Medication Dose Route Frequency Provider Last Rate Last Admin   acetaminophen  (TYLENOL ) tablet 650 mg  650 mg Oral Q6H PRN Motley-Mangrum, Jadeka A, PMHNP   650 mg at 05/06/24 2124   alum & mag hydroxide-simeth (MAALOX/MYLANTA) 200-200-20 MG/5ML suspension 30 mL  30 mL Oral Q4H PRN Motley-Mangrum, Jadeka A, PMHNP       haloperidol  (HALDOL ) tablet 5 mg  5 mg Oral TID PRN Motley-Mangrum, Jadeka A, PMHNP       And   diphenhydrAMINE  (BENADRYL ) capsule 50 mg  50 mg Oral TID PRN Motley-Mangrum, Jadeka A, PMHNP       haloperidol  lactate (HALDOL ) injection 5 mg  5 mg Intramuscular TID PRN Motley-Mangrum, Jadeka A, PMHNP       And   diphenhydrAMINE  (BENADRYL ) injection 50 mg  50 mg Intramuscular TID PRN Motley-Mangrum, Jadeka A, PMHNP       And   LORazepam  (ATIVAN ) injection 2 mg  2 mg Intramuscular TID PRN Motley-Mangrum, Jadeka A, PMHNP       haloperidol  lactate (HALDOL ) injection 10 mg  10 mg Intramuscular TID PRN Motley-Mangrum, Jadeka A, PMHNP       And   diphenhydrAMINE  (BENADRYL ) injection 50 mg  50 mg Intramuscular TID PRN Motley-Mangrum, Jadeka A, PMHNP       And   LORazepam  (ATIVAN ) injection 2 mg  2 mg Intramuscular TID PRN Motley-Mangrum, Jadeka A, PMHNP       [START ON 05/08/2024] ferrous sulfate  EC tablet 325 mg  325 mg Oral Q breakfast Bennett, Christal H, NP       hydrOXYzine  (ATARAX ) tablet 25 mg  25 mg Oral TID PRN Motley-Mangrum, Jadeka A, PMHNP   25 mg at 05/06/24 2125   magnesium  hydroxide (MILK OF MAGNESIA) suspension 30 mL  30 mL Oral Daily PRN Motley-Mangrum, Jadeka A, PMHNP        [START ON 05/08/2024] sertraline  (ZOLOFT ) tablet 50 mg  50 mg Oral Daily Bennett, Christal H, NP       traZODone  (DESYREL ) tablet 50 mg  50 mg Oral QHS PRN Motley-Mangrum, Jadeka A, PMHNP   50 mg at 05/06/24 2124   PTA Medications: No medications prior to admission.    Patient Stressors: Loss of father    Patient Strengths: Ability for insight  Capable of independent living  Physical Health  Supportive family/friends  Work skills   Treatment Modalities: Medication Management, Group therapy, Case management,  1 to 1 session with clinician, Psychoeducation, Recreational therapy.   Physician Treatment Plan for Primary Diagnosis: MDD (major depressive disorder), recurrent severe, without psychosis (HCC) Long Term Goal(s): Improvement in symptoms so as ready for discharge   Short Term Goals: Ability to identify changes in lifestyle to reduce recurrence of condition will improve  Medication Management: Evaluate patient's response, side effects, and tolerance of medication regimen.  Therapeutic Interventions: 1 to 1 sessions, Unit Group sessions and Medication administration.  Evaluation of Outcomes: Not Progressing  Physician Treatment Plan for Secondary Diagnosis: Principal Problem:   MDD (major depressive disorder), recurrent severe, without psychosis (HCC)  Long Term Goal(s): Improvement in symptoms so as ready for discharge   Short Term Goals: Ability to identify changes in lifestyle to  reduce recurrence of condition will improve     Medication Management: Evaluate patient's response, side effects, and tolerance of medication regimen.  Therapeutic Interventions: 1 to 1 sessions, Unit Group sessions and Medication administration.  Evaluation of Outcomes: Not Progressing   RN Treatment Plan for Primary Diagnosis: MDD (major depressive disorder), recurrent severe, without psychosis (HCC) Long Term Goal(s): Knowledge of disease and therapeutic regimen to maintain health  will improve  Short Term Goals: Ability to remain free from injury will improve, Ability to verbalize frustration and anger appropriately will improve, Ability to demonstrate self-control, Ability to participate in decision making will improve, Ability to verbalize feelings will improve, Ability to disclose and discuss suicidal ideas, Ability to identify and develop effective coping behaviors will improve, and Compliance with prescribed medications will improve  Medication Management: RN will administer medications as ordered by provider, will assess and evaluate patient's response and provide education to patient for prescribed medication. RN will report any adverse and/or side effects to prescribing provider.  Therapeutic Interventions: 1 on 1 counseling sessions, Psychoeducation, Medication administration, Evaluate responses to treatment, Monitor vital signs and CBGs as ordered, Perform/monitor CIWA, COWS, AIMS and Fall Risk screenings as ordered, Perform wound care treatments as ordered.  Evaluation of Outcomes: Not Progressing   LCSW Treatment Plan for Primary Diagnosis: MDD (major depressive disorder), recurrent severe, without psychosis (HCC) Long Term Goal(s): Safe transition to appropriate next level of care at discharge, Engage patient in therapeutic group addressing interpersonal concerns.  Short Term Goals: Engage patient in aftercare planning with referrals and resources, Increase social support, Increase ability to appropriately verbalize feelings, Increase emotional regulation, Facilitate acceptance of mental health diagnosis and concerns, Facilitate patient progression through stages of change regarding substance use diagnoses and concerns, Identify triggers associated with mental health/substance abuse issues, and Increase skills for wellness and recovery  Therapeutic Interventions: Assess for all discharge needs, 1 to 1 time with Social worker, Explore available resources and  support systems, Assess for adequacy in community support network, Educate family and significant other(s) on suicide prevention, Complete Psychosocial Assessment, Interpersonal group therapy.  Evaluation of Outcomes: Not Progressing   Progress in Treatment: Attending groups: Yes. Participating in groups: Yes. Taking medication as prescribed: Yes. Toleration medication: Yes. Family/Significant other contact made: No, will contact:  Norval Cleveland (boyfriend) [phone] Patient understands diagnosis: Yes. Discussing patient identified problems/goals with staff: Yes. Medical problems stabilized or resolved: Yes. Denies suicidal/homicidal ideation: Yes. Issues/concerns per patient self-inventory: No.  New problem(s) identified:  No  New Short Term/Long Term Goal(s):    medication stabilization, elimination of SI thoughts, development of comprehensive mental wellness plan.   Patient Goals:  I want to develop better skills for when I'm in a depressed state.    Discharge Plan or Barriers:  Patient recently admitted. CSW will continue to follow and assess for appropriate referrals and possible discharge planning.     Reason for Continuation of Hospitalization: Anxiety Depression Medication stabilization Suicidal ideation  Estimated Length of Stay:  5 - 7 days  Last 3 [place] Suicide Severity Risk Score: Flowsheet Row [facility] (Current[facility] Most recent reading at 05/06/2024  2:48 PM ED from 05/06/2024 in [facility] Department at [facility] Most recent reading at 05/06/2024  7:34 AM ED from 12/10/2022 in [facility] Department at [facility] Most recent reading at 12/10/2022 10:36 AM  C-SSRS RISK CATEGORY High Risk High Risk No Risk    Last PHQ  2/9 Scores:    05/06/2024    9:30 AM  Depression screen PHQ 2/9  Decreased Interest 1  Down, Depressed, Hopeless 2  PHQ - 2 Score 3  Altered  sleeping 2  Tired, decreased energy 1  Change in appetite 1  Feeling bad or failure about yourself  2  Trouble concentrating 1  Moving slowly or fidgety/restless 1  Suicidal thoughts 2  PHQ-9 Score 13    Scribe for Treatment Team: Lasharn Bufkin O Kabe Mckoy, LCSWA 05/07/2024 7:23 PM

## 2024-05-07 NOTE — Group Note (Signed)
 Date:  05/07/2024 Time:  5:14 PM  Group Topic/Focus:  Making Healthy Choices:   The focus of this group is to help patients identify negative/unhealthy choices they were using prior to admission and identify positive/healthier coping strategies to replace them upon discharge.    Participation Level:  Did Not Attend   Annalee Larch 05/07/2024, 5:14 PM
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
## 2024-05-07 NOTE — BHH Group Notes (Signed)
 Spiritual care group on grief and loss facilitated by Chaplain Rockie Sofia, [facility]  Group Goal: Support / Education around grief and loss  Members engage in facilitated group support and psycho-social education.  Group Description:  Following introductions and group rules, group members engaged in facilitated group dialogue and support around topic of loss, with particular support around experiences of loss in their lives. Group Identified types of loss (relationships / self / things) and identified patterns, circumstances, and changes that precipitate losses. Reflected on thoughts / feelings around loss, normalized grief responses, and recognized variety in grief experience. Group encouraged individual reflection on safe space and on the coping skills that they are already utilizing.  Group drew on Adlerian / Rogerian and narrative framework  Patient Progress: Teresa Lucero attended group and actively engaged and participated in group conversation and activities.

## 2024-05-07 NOTE — BH Assessment (Signed)
(  Sleep Hours) - 7.25 (Any PRNs that were needed, meds refused, or side effects to meds)- Hydroxyzine  25 mg PO (Any disturbances and when (visitation, over night)- None (Concerns raised by the patient)- Need order for AD oinment/Lips. Mom said she will bring lip balm. (SI/HI/AVH)- Denies

## 2024-05-07 NOTE — Plan of Care (Signed)
   Problem: Education: Goal: Knowledge of [place] [facility] Education information/materials will improve Outcome: Progressing Goal: Emotional status will improve Outcome: Progressing Goal: Mental status will improve Outcome: Progressing Goal: Verbalization of understanding the information provided will improve Outcome: Progressing
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
## 2024-05-07 NOTE — H&P (Incomplete)
 Psychiatric Admission Assessment Adult  Patient Identification: Teresa Lucero MRN:  [id number] Date of Evaluation:  05/07/2024 Chief Complaint:  MDD (major depressive disorder), recurrent severe, without psychosis (HCC) [F33.2] Principal Diagnosis: MDD (major depressive disorder), recurrent severe, without psychosis (HCC) Diagnosis:  Principal Problem:   MDD (major depressive disorder), recurrent severe, without psychosis (HCC)  History of Present Illness: Teresa Lucero is a 18 year old African-American female        Associated Signs/Symptoms: Depression Symptoms:  depressed mood, insomnia, anxiety, panic attacks, decreased appetite, (Hypo) Manic Symptoms:  Impulsivity, Irritable Mood, Anxiety Symptoms:  Panic Symptoms, Psychotic Symptoms:  Denies PTSD Symptoms: Had a traumatic exposure:  *** Re-experiencing:  Flashbacks Intrusive Thoughts Total Time spent with patient: 1.5 hours  Past Psychiatric History: ***  Is the patient at risk to self? No.  Has the patient been a risk to self in the past 6 months? No.  Has the patient been a risk to self within the distant past? Yes.    Is the patient a risk to others? No.  Has the patient been a risk to others in the past 6 months? No.  Has the patient been a risk to others within the distant past? No.   [place] Scale:  Flowsheet Row Ad[facility] (Current) [facility] Most recent reading at 05/06/2024  2:48 PM ED from 05/06/2024 in [facility] Emergency Department at [facility] Most recent reading at 05/06/2024  7:34 AM ED from 12/10/2022 in [facility] Emergency Department at [facility] Most recent reading at 12/10/2022 10:36 AM  C-SSRS RISK CATEGORY High Risk High Risk No Risk     Prior Inpatient Therapy: No.  Prior Outpatient Therapy: Yes.   If yes, describe***   Alcohol Screening: 1. How often do you have a drink containing alcohol?: Never 2. How many drinks  containing alcohol do you have on a typical day when you are drinking?: 1 or 2 3. How often do you have six or more drinks on one occasion?: Never AUDIT-C Score: 0 4. How often during the last year have you found that you were not able to stop drinking once you had started?: Never 5. How often during the last year have you failed to do what was normally expected from you because of drinking?: Never 6. How often during the last year have you needed a first drink in the morning to get yourself going after a heavy drinking session?: Never 7. How often during the last year have you had a feeling of guilt of remorse after drinking?: Never 8. How often during the last year have you been unable to remember what happened the night before because you had been drinking?: Never 9. Have you or someone else been injured as a result of your drinking?: No 10. Has a relative or friend or a doctor or another health worker been concerned about your drinking or suggested you cut down?: No Alcohol Use Disorder Identification Test Final Score (AUDIT): 0 Alcohol Brief Interventions/Follow-up: Alcohol education/Brief advice Substance Abuse History in the last 12 months:  Yes.   Consequences of Substance Abuse: NA Previous Psychotropic Medications: No  Psychological Evaluations: No  Past Medical History:  Past Medical History:  Diagnosis Date   Irregular heart beat     Past Surgical History:  Procedure Laterality Date   TONSILLECTOMY     Family History:  Family History  Problem Relation Age of Onset   Migraines Maternal Aunt    Seizures  Neg Hx    Autism Neg Hx    ADD / ADHD Neg Hx    Anxiety disorder Neg Hx    Depression Neg Hx    Bipolar disorder Neg Hx    Schizophrenia Neg Hx    Family Psychiatric  History: As listed above Tobacco Screening:  Social History   Tobacco Use  Smoking Status Never  Smokeless Tobacco Never    BH Tobacco Counseling     Are you interested in Tobacco Cessation  Medications?  N/A, patient does not use tobacco products Counseled patient on smoking cessation:  N/A, patient does not use tobacco products Reason Tobacco Screening Not Completed: No value filed.       Social History:  Social History   Substance and Sexual Activity  Alcohol Use No     Social History   Substance and Sexual Activity  Drug Use No    Additional Social History: Marital status: Single Are you sexually active?: Yes What is your sexual orientation?: Heterosexual Has your sexual activity been affected by drugs, alcohol, medication, or emotional stress?: No Does patient have children?: No                         Allergies:  No Known Allergies Lab Results:  Results for orders placed or performed during the hospital encounter of 05/06/24 (from the past 48 hours)  Comprehensive metabolic panel     Status: Abnormal   Collection Time: 05/06/24  6:17 AM  Result Value Ref Range   Sodium 140 135 - 145 mmol/L   Potassium 3.4 (L) 3.5 - 5.1 mmol/L   Chloride 103 98 - 111 mmol/L   CO2 23 22 - 32 mmol/L   Glucose, Bld 90 70 - 99 mg/dL    Comment: Glucose reference range applies only to samples taken after fasting for at least 8 hours.   BUN 12 6 - 20 mg/dL   Creatinine, Ser 9.31 0.44 - 1.00 mg/dL   Calcium 9.5 8.9 - 89.6 mg/dL   Total Protein 7.6 6.5 - 8.1 g/dL   Albumin 4.5 3.5 - 5.0 g/dL   AST 17 15 - 41 U/L   ALT 14 0 - 44 U/L   Alkaline Phosphatase 52 38 - 126 U/L   Total Bilirubin 0.3 0.0 - 1.2 mg/dL   GFR, Estimated >39 >39 mL/min    Comment: (NOTE) Calculated using the CKD-EPI Creatinine Equation (2021)    Anion gap 13 5 - 15    Comment: Performed at [facility], [address] [place], [place] [address]  CBC with Differential     Status: Abnormal   Collection Time: 05/06/24  6:17 AM  Result Value Ref Range   WBC 10.3 4.0 - 10.5 K/uL   RBC 4.86 3.87 - 5.11 MIL/uL   Hemoglobin 7.3 (L) 12.0 - 15.0 g/dL    Comment: Reticulocyte  Hemoglobin testing may be clinically indicated, consider ordering this additional test [id number]    HCT 26.5 (L) 36.0 - 46.0 %   MCV 54.5 (L) 80.0 - 100.0 fL    Comment: REPEATED TO VERIFY   MCH 15.0 (L) 26.0 - 34.0 pg   MCHC 27.5 (L) 30.0 - 36.0 g/dL   RDW 77.4 (H) 88.4 - 84.4 %   Platelets 503 (H) 150 - 400 K/uL   nRBC 0.0 0.0 - 0.2 %   Neutrophils Relative % 76 %   Neutro Abs 7.7 1.7 - 7.7 K/uL  Lymphocytes Relative 13 %   Lymphs Abs 1.4 0.7 - 4.0 K/uL   Monocytes Relative 11 %   Monocytes Absolute 1.2 (H) 0.1 - 1.0 K/uL   Eosinophils Relative 0 %   Eosinophils Absolute 0.0 0.0 - 0.5 K/uL   Basophils Relative 0 %   Basophils Absolute 0.0 0.0 - 0.1 K/uL   Immature Granulocytes 0 %   Abs Immature Granulocytes 0.03 0.00 - 0.07 K/uL    Comment: Performed at [facility], [address] [place], [place] [address]  Troponin T, High Sensitivity     Status: None   Collection Time: 05/06/24  6:17 AM  Result Value Ref Range   Troponin T High Sensitivity <15 0 - 19 ng/L    Comment: (NOTE) Biotin concentrations > 1000 ng/mL falsely decrease TnT results.  Serial cardiac troponin measurements are suggested.  Refer to the Links section for chest pain algorithms and additional  guidance. Performed at [facility], [address] [place], [place] [address]   D-dimer, quantitative     Status: Abnormal   Collection Time: 05/06/24  6:17 AM  Result Value Ref Range   D-Dimer, Quant 0.67 (H) 0.00 - 0.50 ug/mL-FEU    Comment: (NOTE) At the manufacturer cut-off value of 0.5 g/mL FEU, this assay has a negative predictive value of 95-100%.This assay is intended for use in conjunction with a clinical pretest probability (PTP) assessment model to exclude pulmonary embolism (PE) and deep venous thrombosis (DVT) in outpatients suspected of PE or DVT. Results should be correlated with clinical presentation. Performed at [facility], [address]  [place], [place] [address]   Pregnancy serum, qualitative     Status: None   Collection Time: 05/06/24  6:17 AM  Result Value Ref Range   Preg, Serum NEGATIVE NEGATIVE    Comment:        THE SENSITIVITY OF THIS METHODOLOGY IS >10 mIU/mL. Performed at [facility], [address] [place], [place] [address]   Ethanol     Status: None   Collection Time: 05/06/24  6:18 AM  Result Value Ref Range   Alcohol, Ethyl (B) <15 <15 mg/dL    Comment: (NOTE) For medical purposes only. Performed at [facility], [address] [place], [place] [address]   Urine rapid drug screen (hosp performed)     Status: Abnormal   Collection Time: 05/06/24  6:18 AM  Result Value Ref Range   Opiates NEGATIVE NEGATIVE   Cocaine NEGATIVE NEGATIVE   Benzodiazepines NEGATIVE NEGATIVE   Amphetamines NEGATIVE NEGATIVE   Tetrahydrocannabinol POSITIVE (A) NEGATIVE   Barbiturates NEGATIVE NEGATIVE   Methadone Scn, Ur NEGATIVE NEGATIVE   Fentanyl  NEGATIVE NEGATIVE    Comment: (NOTE) Drug screen is for Medical Purposes only. Positive results are preliminary only. If confirmation is needed, notify lab within 5 days.  Drug Class                 Cutoff (ng/mL) Amphetamine and metabolites 1000 Barbiturate and metabolites 200 Benzodiazepine              200 Opiates and metabolites     300 Cocaine and metabolites     300 THC                         50 Fentanyl   5 Methadone                   300  Trazodone  is metabolized in vivo to several metabolites,  including pharmacologically active m-CPP, which is excreted in the  urine.  Immunoassay screens for amphetamines and MDMA have potential  cross-reactivity with these compounds and may provide false positive  result.  Performed at [facility], [address] [place], [place] [address]   Troponin T, High Sensitivity     Status: None   Collection Time: 05/06/24  8:15 AM  Result Value Ref Range   Troponin T  High Sensitivity <15 0 - 19 ng/L    Comment: (NOTE) Biotin concentrations > 1000 ng/mL falsely decrease TnT results.  Serial cardiac troponin measurements are suggested.  Refer to the Links section for chest pain algorithms and additional  guidance. Performed at [facility], [address] [place], [place] [address]     Blood Alcohol level:  Lab Results  Component Value Date   [place] <15 05/06/2024    Metabolic Disorder Labs:  No results found for: HGBA1C, MPG No results found for: PROLACTIN No results found for: CHOL, TRIG, HDL, CHOLHDL, VLDL, LDLCALC  Current Medications: Current Facility-Administered Medications  Medication Dose Route Frequency Provider Last Rate Last Admin   acetaminophen  (TYLENOL ) tablet 650 mg  650 mg Oral Q6H PRN Motley-Mangrum, Jadeka A, PMHNP   650 mg at 05/06/24 2124   alum & mag hydroxide-simeth (MAALOX/MYLANTA) 200-200-20 MG/5ML suspension 30 mL  30 mL Oral Q4H PRN Motley-Mangrum, Jadeka A, PMHNP       haloperidol  (HALDOL ) tablet 5 mg  5 mg Oral TID PRN Motley-Mangrum, Jadeka A, PMHNP       And   diphenhydrAMINE  (BENADRYL ) capsule 50 mg  50 mg Oral TID PRN Motley-Mangrum, Jadeka A, PMHNP       haloperidol  lactate (HALDOL ) injection 5 mg  5 mg Intramuscular TID PRN Motley-Mangrum, Jadeka A, PMHNP       And   diphenhydrAMINE  (BENADRYL ) injection 50 mg  50 mg Intramuscular TID PRN Motley-Mangrum, Jadeka A, PMHNP       And   LORazepam  (ATIVAN ) injection 2 mg  2 mg Intramuscular TID PRN Motley-Mangrum, Jadeka A, PMHNP       haloperidol  lactate (HALDOL ) injection 10 mg  10 mg Intramuscular TID PRN Motley-Mangrum, Jadeka A, PMHNP       And   diphenhydrAMINE  (BENADRYL ) injection 50 mg  50 mg Intramuscular TID PRN Motley-Mangrum, Jadeka A, PMHNP       And   LORazepam  (ATIVAN ) injection 2 mg  2 mg Intramuscular TID PRN Motley-Mangrum, Jadeka A, PMHNP       drospirenone -ethinyl estradiol  (YAZ) 3-0.02 MG per tablet 1 tablet  1  tablet Oral Daily Motley-Mangrum, Jadeka A, PMHNP       hydrOXYzine  (ATARAX ) tablet 25 mg  25 mg Oral TID PRN Motley-Mangrum, Jadeka A, PMHNP   25 mg at 05/06/24 2125   magnesium  hydroxide (MILK OF MAGNESIA) suspension 30 mL  30 mL Oral Daily PRN Motley-Mangrum, Jadeka A, PMHNP       sertraline  (ZOLOFT ) tablet 25 mg  25 mg Oral Daily Jalina Blowers H, NP       Followed by   NOREEN ON 05/08/2024] sertraline  (ZOLOFT ) tablet 50 mg  50 mg Oral Daily Vicent Febles H, NP       traZODone  (DESYREL ) tablet 50 mg  50 mg Oral QHS PRN Motley-Mangrum, Jadeka A, PMHNP  50 mg at 05/06/24 2124   PTA Medications: No medications prior to admission.    AIMS:  , \N/A ,  ,  ,  ,  ,    Musculoskeletal: Strength & Muscle Tone: within normal limits Gait & Station: normal Patient leans: N/A            Psychiatric Specialty Exam:  Presentation  General Appearance: Appropriate for Environment; Casual  Eye Contact:Good  Speech:Clear and Coherent; Normal Rate  Speech Volume:Normal  Handedness:Right   Mood and Affect  Mood:Anxious; Euthymic  Affect:Congruent   Thought Process  Thought Processes:Coherent; Linear  Duration of Psychotic Symptoms:N/A Past Diagnosis of Schizophrenia or Psychoactive disorder: No data recorded Descriptions of Associations:Intact  Orientation:Full (Time, Place and Person)  Thought Content:Logical  Hallucinations:Hallucinations: None  Ideas of Reference:None  Suicidal Thoughts:Suicidal Thoughts: No  Homicidal Thoughts:Homicidal Thoughts: No   Sensorium  Memory:Immediate Good; Recent Good; Remote Good  Judgment:Fair  Insight:Fair   Executive Functions  Concentration:Good  Attention Span:Good  Recall:Good  Fund of Knowledge:Fair  Language:Good   Psychomotor Activity  Psychomotor Activity:Psychomotor Activity: Normal   Assets  Assets:Communication Skills; Desire for Improvement; Financial Resources/Insurance; Housing; Leisure  Time; Physical Health; Resilience; Social Support; Talents/Skills   Sleep  Sleep:Sleep: Fair  Estimated Sleeping Duration (Last 24 Hours): 5.75-7.25 hours   Physical Exam: Physical Exam Vitals and nursing note reviewed.  Constitutional:      General: She is not in acute distress.    Appearance: She is normal weight. She is not ill-appearing.  HENT:     Mouth/Throat:     Pharynx: Oropharynx is clear.  Cardiovascular:     Rate and Rhythm: Normal rate.     Pulses: Normal pulses.  Skin:    General: Skin is dry.  Neurological:     General: No focal deficit present.     Mental Status: She is alert and oriented to person, place, and time.    Review of Systems  Constitutional: Negative.   HENT: Negative.    Eyes: Negative.   Respiratory: Negative.    Cardiovascular: Negative.   Gastrointestinal: Negative.   Genitourinary: Negative.   Musculoskeletal: Negative.   Skin: Negative.   Neurological: Negative.   Psychiatric/Behavioral:  Positive for depression and substance abuse. Negative for hallucinations, memory loss and suicidal ideas. The patient is nervous/anxious and has insomnia.    Blood pressure 112/64, pulse 97, temperature 98.5 F (36.9 C), temperature source Oral, resp. rate 14, height 5' 2 (1.575 m), weight 52.2 kg, last menstrual period 04/30/2024, SpO2 100%. Body mass index is 21.03 kg/m.  Treatment Plan Summary: Daily contact with patient to assess and evaluate symptoms and progress in treatment and Medication management  Observation Level/Precautions:  15 minute checks  Laboratory:  Reviewed admission labs: UDS positive for Tetrahydrocannabinol Ethanol < 15 Pregnancy serum -negative D-Dimer 0.67 CBC with Differential - Hemoglobin 7.3, HCT 26.5, MCV 54.5, MCH 15.0, MCHC 27.5, RDW 22.5, Platelets 503, Monocytes Absolute 1.2 otherwise unremarkable CMP - Potassium 3.4 otherwise unremarkable  New lab orders:  TSH, Hemoglobin A1c, Vitamin D , Lipid panel   Psychotherapy: Enrolled in group therapies  Medications:    # MDD # GAD - Start sertraline  25 mg x 1 dose then 50 daily mg thereafter  BH agitation Haldol  protocol (see MAR) Hydroxyzine  25 mg 3 times daily as needed, anxiety Trazodone  50 mg at bedtime as needed, sleep   Medical # Anemia   Tylenol  650 mg per 6 hours as needed, mild pain Maalox 30 mL every  4 hours as needed, indigestion Milk of Magnesia 30 mL daily as needed, mild constipation    Consultations: As needed  Discharge Concerns: Safety precautions  Estimated LOS: 3 to 5 days  Other: N/A   Physician Treatment Plan for Primary Diagnosis: MDD (major depressive disorder), recurrent severe, without psychosis (HCC) Long Term Goal(s): Improvement in symptoms so as ready for discharge  Short Term Goals: Ability to identify changes in lifestyle to reduce recurrence of condition will improve  Physician Treatment Plan for Secondary Diagnosis: Principal Problem:   MDD (major depressive disorder), recurrent severe, without psychosis (HCC)  Long Term Goal(s): Improvement in symptoms so as ready for discharge  Short Term Goals: Ability to identify changes in lifestyle to reduce recurrence of condition will improve  I certify that inpatient services furnished can reasonably be expected to improve the patient's condition.    Blair Chiquita Hint, NP 9/3/202512:28 PM

## 2024-05-07 NOTE — Group Note (Signed)
 Date:  05/07/2024 Time:  10:52 PM  Group Topic/Focus:  Wrap-Up Group:   The focus of this group is to help patients review their daily goal of treatment and discuss progress on daily workbooks.    Participation Level:  Active  Participation Quality:  Appropriate and Sharing  Affect:  Appropriate  Cognitive:  Appropriate  Insight: Appropriate  Engagement in Group:  Engaged  Modes of Intervention:  Activity and Socialization  Additional Comments:  Patient completed wrap up group sheet. Patient rated her day a 8.5 out of 10. Patient goal for today, go to all groups. Patient did achieve gaol. Coping skills the patient finds most helpful, coloring, meeting new people/talking to them. Something the patient likes about herself, my kindness.   Eward Mace 05/07/2024, 10:52 PM

## 2024-05-07 NOTE — Group Note (Signed)
 Date:  05/07/2024 Time:  10:18 AM  Group Topic/Focus:  Goals Group:   The focus of this group is to help patients establish daily goals to achieve during treatment and discuss how the patient can incorporate goal setting into their daily lives to aide in recovery.    Participation Level:  Active  Participation Quality:  Appropriate  Affect:  Appropriate  Cognitive:  Appropriate  Insight: Appropriate  Engagement in Group:  Engaged  Modes of Intervention:  Discussion  Additional Comments:  NA  Teresa Lucero [name] 05/07/2024, 10:18 AM

## 2024-05-07 NOTE — Plan of Care (Signed)
   Problem: Education: Goal: Emotional status will improve Outcome: Progressing Goal: Mental status will improve Outcome: Progressing Goal: Verbalization of understanding the information provided will improve Outcome: Progressing

## 2024-05-08 LAB — LIPID PANEL
Cholesterol: 129 mg/dL (ref 0–200)
HDL: 44 mg/dL (ref >40)
LDL Cholesterol: 77 mg/dL (ref 0–99)
Total CHOL/HDL Ratio: 2.9 ratio
Triglycerides: 40 mg/dL (ref <150)
VLDL: 8 mg/dL (ref 0–40)

## 2024-05-08 LAB — HEMOGLOBIN A1C
Hgb A1c MFr Bld: 5.3 % (ref 4.8–5.6)
Mean Plasma Glucose: 105.41 mg/dL

## 2024-05-08 LAB — VITAMIN D 25 HYDROXY (VIT D DEFICIENCY, FRACTURES): Vit D, 25-Hydroxy: 17.27 ng/mL — ABNORMAL LOW (ref 30–100)

## 2024-05-08 NOTE — [facility] Group Notes (Signed)
 Adult Psychoeducational Group Note  Date:  05/08/2024 Time:  10:03 AM  Group Topic/Focus:  Goals Group:   The focus of this group is to help patients establish daily goals to achieve during treatment and discuss how the patient can incorporate goal setting into their daily lives to aide in recovery.  Orientation:   The focus of this group is to educate the patient on the purpose and policies of crisis stabilization and provide a format to answer questions about their admission.  The group details unit policies and expectations of patients while admitted.  Participation Level:  Active  Participation Quality:  Appropriate  Affect:  Appropriate  Cognitive:  Alert  Insight: Appropriate  Engagement in Group:  Engaged  Modes of Intervention:  Orientation  Additional Comments:  Pt goal for today is to keep talking to peers and be more engaged.   Teresa Lucero [name] 05/08/2024, 10:03 AM

## 2024-05-08 NOTE — Plan of Care (Signed)
   Problem: Education: Goal: Knowledge of [place] [facility] Education information/materials will improve Outcome: Progressing Goal: Emotional status will improve Outcome: Progressing Goal: Mental status will improve Outcome: Progressing Goal: Verbalization of understanding the information provided will improve Outcome: Progressing

## 2024-05-08 NOTE — Plan of Care (Signed)
   Problem: Education: Goal: Knowledge of [facility] Education information/materials will improve Outcome: Progressing Goal: Verbalization of understanding the information provided will improve Outcome: Progressing   Problem: Activity: Goal: Interest or engagement in activities will improve Outcome: Progressing

## 2024-05-08 NOTE — Group Note (Signed)
 Occupational Therapy Group Note  Group Topic: Sleep Hygiene  Group Date: 05/08/2024 Start Time: 1500 End Time: 1530 Facilitators: Dot [place] MATSU, OT   Group Description: Group encouraged increased participation and engagement through topic focused on sleep hygiene. Patients reflected on the quality of sleep they typically receive and identified areas that need improvement. Group was given background information on sleep and sleep hygiene, including common sleep disorders. Group members also received information on how to improve one's sleep and introduced a sleep diary as a tool that can be utilized to track sleep quality over a length of time. Group session ended with patients identifying one or more strategies they could utilize or implement into their sleep routine in order to improve overall sleep quality.        Therapeutic Goal(s):  Identify one or more strategies to improve overall sleep hygiene  Identify one or more areas of sleep that are negatively impacted (sleep too much, too little, etc)     Participation Level: Engaged   Participation Quality: Independent   Behavior: Appropriate   Speech/Thought Process: Relevant   Affect/Mood: Appropriate   Insight: Fair   Judgement: Fair      Modes of Intervention: Education  Patient Response to Interventions:  Attentive   Plan: Continue to engage patient in OT groups 2 - 3x/week.  05/08/2024  [place] MATSU Dot, OT   Zina Pitzer, OT

## 2024-05-08 NOTE — Progress Notes (Signed)
 [facility] MD Progress Note  05/08/2024 10:09 AM Teresa Lucero  MRN:  [id number]  Principal Problem: MDD (major depressive disorder), recurrent severe, without psychosis (HCC) Diagnosis: Principal Problem:   MDD (major depressive disorder), recurrent severe, without psychosis (HCC)  Reason for admission: Teresa Lucero is a 18 year old African-American female with a self reported history of anxiety and depression admitted to the [facility] following presentation to [facility].  She initially reported chest pain, which prompted a cardiac workup that was reportedly unremarkable though CBC revealed anemia. During medical evaluation, she disclosed experiencing severe depressive symptoms, high levels of stress, and suicidal ideation.  Past medical history is notable for anemia and a heart murmur.   24-Hour chart review: Patient case discussed in the interdisciplinary team meeting.  Vital signs reviewed without critical values.  PRNs of trazodone  x 1 required for insomnia.  No agitation protocol required.  Today's assessment notes:  On assessment today, the pt reports that her mood is improving and with less depressed.  Rates depression/anxiety as numbers 0/10 with 10 being most severe.  Mood is euthymic with congruent affect.  Chart reviewed and findings shared with the treatment team and consulted attending psychiatrist with recommendation to continue current treatment plan with Zoloft  50 mg p.o daily for depression and anxiety. Denies having side effects to current psychiatric medications.  She continues on moderate iron tablet for anemia.  She denies delusional thinking or paranoia.  Further denies SI, HI, or AVH. Reports that anxiety is at manageable level. Sleep is stable.  Nursing staff report patient sleeping 7.25 hours and being restful Appetite is stable Concentration is improving  Energy level is adequate   We discussed compliance to current medication  regime.      Total Time spent with patient: 45 minutes  Past Psychiatric History: Previous Psych Diagnoses: None formally diagnosed Prior inpatient treatment: None Current/prior outpatient treatment: Therapy briefly in 7th grade Prior rehab tx: None Psychotherapy tx: Interested in community therapy History of suicide: Past attempt >6 months ago History of homicide: Denies Psychiatric medication history: None; occasional melatonin gummies Psychiatric medication compliance history: N/A Neuromodulation history: None Current Psychiatrist: None Current therapist: None   Past Medical History:  Past Medical History:  Diagnosis Date   Irregular heart beat     Past Surgical History:  Procedure Laterality Date   TONSILLECTOMY     Family History:  Family History  Problem Relation Age of Onset   Migraines Maternal Aunt    Seizures Neg Hx    Autism Neg Hx    ADD / ADHD Neg Hx    Anxiety disorder Neg Hx    Depression Neg Hx    Bipolar disorder Neg Hx    Schizophrenia Neg Hx    Family Psychiatric  History: See H&P Social History:  Social History   Substance and Sexual Activity  Alcohol Use No     Social History   Substance and Sexual Activity  Drug Use No    Social History   Socioeconomic History   Marital status: Single    Spouse name: Not on file   Number of children: Not on file   Years of education: Not on file   Highest education level: Not on file  Occupational History   Not on file  Tobacco Use   Smoking status: Never   Smokeless tobacco: Never  Vaping Use   Vaping status: Never Used  Substance and Sexual Activity   Alcohol use: No   Drug  use: No   Sexual activity: Not Currently  Other Topics Concern   Not on file  Social History Narrative   Patient lives with mom and dad. Siblings are older and do not live at home. She is in the 7th grade at [employer]. She does well in school. She enjoys dancing, cheer, and basketball   Social Drivers of Health    Financial Resource Strain: Not on file  Food Insecurity: No Food Insecurity (05/06/2024)   Hunger Vital Sign    Worried About Running Out of Food in the Last Year: Never true    Ran Out of Food in the Last Year: Never true  Transportation Needs: No Transportation Needs (05/06/2024)   PRAPARE - Administrator, Civil Service (Medical): No    Lack of Transportation (Non-Medical): No  Physical Activity: Not on file  Stress: Not on file  Social Connections: Unknown (12/14/2022)   Received from [employer]   Social Network    Social Network: Not on file   Additional Social History:    Sleep: Good Estimated Sleeping Duration (Last 24 Hours): 7.00-7.50 hours  Appetite:  Good  Current Medications: Current Facility-Administered Medications  Medication Dose Route Frequency Provider Last Rate Last Admin   acetaminophen  (TYLENOL ) tablet 650 mg  650 mg Oral Q6H PRN Motley-Mangrum, Jadeka A, PMHNP   650 mg at 05/06/24 2124   alum & mag hydroxide-simeth (MAALOX/MYLANTA) 200-200-20 MG/5ML suspension 30 mL  30 mL Oral Q4H PRN Motley-Mangrum, Jadeka A, PMHNP       haloperidol  (HALDOL ) tablet 5 mg  5 mg Oral TID PRN Motley-Mangrum, Jadeka A, PMHNP       And   diphenhydrAMINE  (BENADRYL ) capsule 50 mg  50 mg Oral TID PRN Motley-Mangrum, Jadeka A, PMHNP       haloperidol  lactate (HALDOL ) injection 5 mg  5 mg Intramuscular TID PRN Motley-Mangrum, Jadeka A, PMHNP       And   diphenhydrAMINE  (BENADRYL ) injection 50 mg  50 mg Intramuscular TID PRN Motley-Mangrum, Jadeka A, PMHNP       And   LORazepam  (ATIVAN ) injection 2 mg  2 mg Intramuscular TID PRN Motley-Mangrum, Jadeka A, PMHNP       haloperidol  lactate (HALDOL ) injection 10 mg  10 mg Intramuscular TID PRN Motley-Mangrum, Jadeka A, PMHNP       And   diphenhydrAMINE  (BENADRYL ) injection 50 mg  50 mg Intramuscular TID PRN Motley-Mangrum, Jadeka A, PMHNP       And   LORazepam  (ATIVAN ) injection 2 mg  2 mg Intramuscular TID PRN  Motley-Mangrum, Jadeka A, PMHNP       ferrous sulfate  tablet 325 mg  325 mg Oral Q breakfast Bennett, Christal H, NP   325 mg at 05/08/24 0947   hydrOXYzine  (ATARAX ) tablet 25 mg  25 mg Oral TID PRN Motley-Mangrum, Jadeka A, PMHNP   25 mg at 05/06/24 2125   magnesium  hydroxide (MILK OF MAGNESIA) suspension 30 mL  30 mL Oral Daily PRN Motley-Mangrum, Jadeka A, PMHNP       sertraline  (ZOLOFT ) tablet 50 mg  50 mg Oral Daily Bennett, Christal H, NP   50 mg at 05/08/24 0803   traZODone  (DESYREL ) tablet 50 mg  50 mg Oral QHS PRN Motley-Mangrum, Jadeka A, PMHNP   50 mg at 05/07/24 2109   Lab Results:  Results for orders placed or performed during the hospital encounter of 05/06/24 (from the past 48 hours)  TSH     Status: None  Collection Time: 05/07/24  6:23 PM  Result Value Ref Range   TSH 1.170 0.350 - 4.500 uIU/mL    Comment: Performed at [facility], [address], [place], [place] [address]  Lipid panel     Status: None   Collection Time: 05/08/24  6:26 AM  Result Value Ref Range   Cholesterol 129 0 - 200 mg/dL    Comment:        ATP III CLASSIFICATION:  <200     mg/dL   Desirable  799-760  mg/dL   Borderline High  >=759    mg/dL   High           Triglycerides 40 <150 mg/dL   HDL 44 >59 mg/dL   Total CHOL/HDL Ratio 2.9 RATIO   VLDL 8 0 - 40 mg/dL   LDL Cholesterol 77 0 - 99 mg/dL    Comment:        Total Cholesterol/HDL:CHD Risk Coronary Heart Disease Risk Table                     Men   Women  1/2 Average Risk   3.4   3.3  Average Risk       5.0   4.4  2 X Average Risk   9.6   7.1  3 X Average Risk  23.4   11.0        Use the calculated Patient Ratio above and the CHD Risk Table to determine the patient's CHD Risk.        ATP III CLASSIFICATION (LDL):  <100     mg/dL   Optimal  899-870  mg/dL   Near or Above                    Optimal  130-159  mg/dL   Borderline  839-810  mg/dL   High  >809     mg/dL   Very High Performed at [facility], [address], [place], [place] [address]    Blood Alcohol level:  Lab Results  Component Value Date   [facility] <15 05/06/2024   Metabolic Disorder Labs: No results found for: HGBA1C, MPG No results found for: PROLACTIN Lab Results  Component Value Date   CHOL 129 05/08/2024   TRIG 40 05/08/2024   HDL 44 05/08/2024   CHOLHDL 2.9 05/08/2024   VLDL 8 05/08/2024   LDLCALC 77 05/08/2024   Physical Findings: AIMS:  ,  ,  ,  ,  ,  ,   CIWA:    COWS:     Musculoskeletal: Strength & Muscle Tone: within normal limits Gait & Station: normal Patient leans: N/A  Psychiatric Specialty Exam:  Presentation  General Appearance:  Appropriate for Environment; Casual  Eye Contact: Good  Speech: Clear and Coherent  Speech Volume: Normal  Handedness: Right  Mood and Affect  Mood: Euthymic  Affect: Congruent  Thought Process  Thought Processes: Coherent  Descriptions of Associations:Intact  Orientation:Full (Time, Place and Person)  Thought Content:Logical  History of Schizophrenia/Schizoaffective disorder:No data recorded Duration of Psychotic Symptoms:No data recorded Hallucinations:Hallucinations: None  Ideas of Reference:None  Suicidal Thoughts:Suicidal Thoughts: No  Homicidal Thoughts:Homicidal Thoughts: No  Sensorium  Memory: Immediate Good; Recent Good  Judgment: Fair  Insight: Fair  Chartered certified accountant: Fair  Attention Span: Fair  Recall: Fair  Fund of Knowledge: Fair  Language: Good  Psychomotor Activity  Psychomotor Activity: Psychomotor Activity: Normal  Assets  Assets: Manufacturing systems engineer; Physical Health; Resilience  Sleep  Sleep: Sleep: Fair    Physical Exam: Physical Exam Vitals and nursing note reviewed.  Constitutional:      General: She is not in acute distress.    Appearance: She is not ill-appearing.  HENT:     Head: Normocephalic.     Right Ear: External ear  normal.     Left Ear: External ear normal.     Mouth/Throat:     Mouth: Mucous membranes are moist.     Pharynx: Oropharynx is clear.  Eyes:     Extraocular Movements: Extraocular movements intact.  Cardiovascular:     Rate and Rhythm: Normal rate.     Pulses: Normal pulses.  Pulmonary:     Effort: Pulmonary effort is normal.  Abdominal:     Comments: Deferred  Genitourinary:    Comments: Deferred  Musculoskeletal:        General: Normal range of motion.     Cervical back: Normal range of motion.  Skin:    General: Skin is warm.  Neurological:     General: No focal deficit present.     Mental Status: She is alert and oriented to person, place, and time.  Psychiatric:        Mood and Affect: Mood normal.        Behavior: Behavior normal.    Review of Systems  Constitutional:  Negative for chills and fever.  HENT:  Negative for sore throat.   Eyes:  Negative for blurred vision.  Respiratory:  Negative for cough, sputum production, shortness of breath and wheezing.   Cardiovascular:  Negative for chest pain and palpitations.  Gastrointestinal:  Negative for abdominal pain, constipation, diarrhea, heartburn, nausea and vomiting.  Genitourinary:  Negative for dysuria.  Musculoskeletal:  Negative for falls.  Skin:  Negative for itching and rash.  Neurological:  Negative for dizziness and headaches.  Endo/Heme/Allergies:        See allergy listing  Psychiatric/Behavioral:  Positive for depression and substance abuse. Negative for hallucinations and suicidal ideas. The patient is nervous/anxious. The patient does not have insomnia.    Blood pressure (!) 111/59, pulse 76, temperature 98.3 F (36.8 C), temperature source Oral, resp. rate 16, height 5' 2 (1.575 m), weight 52.2 kg, last menstrual period 04/30/2024, SpO2 100%. Body mass index is 21.03 kg/m.  Treatment Plan Summary: Daily contact with patient to assess and evaluate symptoms and progress in treatment and  Medication management Treatment Plan Summary: Daily contact with patient to assess and evaluate symptoms and progress in treatment and Medication management   Observation Level/Precautions:  15 minute checks  Laboratory:  Reviewed admission labs: UDS positive for Tetrahydrocannabinol Ethanol < 15 Pregnancy serum -negative D-Dimer 0.67 CBC with Differential - Hemoglobin 7.3, HCT 26.5, MCV 54.5, MCH 15.0, MCHC 27.5, RDW 22.5, Platelets 503, Monocytes Absolute 1.2 otherwise unremarkable CMP - Potassium 3.4 otherwise unremarkable   New lab orders:  TSH, Hemoglobin A1c, Vitamin D , Lipid panel  Psychotherapy: Enrolled in group therapies  Medications:     # MDD # GAD - Sertraline  25 mg x 1 dose then 50 daily mg thereafter -Continue Sertraline  50 mg po daily for depression and anxiety   BH agitation Haldol  protocol (see MAR) Hydroxyzine  25 mg 3 times daily as needed, anxiety Trazodone  50 mg at bedtime as needed, sleep   Medical # Anemia  - Restart ferrous sulfate  EC tablet 325 mg daily with breakfast   Tylenol  650 mg per 6 hours  as needed, mild pain Maalox 30 mL every 4 hours as needed, indigestion Milk of Magnesia 30 mL daily as needed, mild constipation      Consultations: As needed  Discharge Concerns: Safety precautions  Estimated LOS: 3 to 5 days  Other: N/A    Physician Treatment Plan for Primary Diagnosis: MDD (major depressive disorder), recurrent severe, without psychosis (HCC) Long Term Goal(s): Improvement in symptoms so as ready for discharge   Short Term Goals: Ability to identify changes in lifestyle to reduce recurrence of condition will improve   Physician Treatment Plan for Secondary Diagnosis: Principal Problem:   MDD (major depressive disorder), recurrent severe, without psychosis (HCC)   Long Term Goal(s): Improvement in symptoms so as ready for discharge   Short Term Goals: Ability to identify changes in lifestyle to reduce recurrence of condition will  improve   I certify that inpatient services furnished can reasonably be expected to improve the patient's condition.     Ellouise JAYSON Azure, FNP 05/08/2024, 10:09 AM

## 2024-05-08 NOTE — [facility] Group Notes (Signed)
 [facility] Group Notes:  (Nursing/MHT/Case Management/Adjunct)  Date:  05/08/2024  Time:  11:58 PM  Type of Therapy:  Wrap-up group  Participation Level:  Active  Participation Quality:  Appropriate  Affect:  Appropriate  Cognitive:  Appropriate  Insight:  Appropriate  Engagement in Group:  Engaged  Modes of Intervention:  Education  Summary of Progress/Problems: Goal to be D/C. Rated day 9/10.  Teresa Lucero 05/08/2024, 11:58 PM

## 2024-05-08 NOTE — Group Note (Signed)
 LCSW Group Therapy Note   Group Date: 05/08/2024 Start Time: 1100 End Time: 1200   Participation:  patient was present and actively participated in the discussion   Type of Therapy:  Group Therapy  Topic:  Healing Hearts:  A Safe Space for Grief  Objective:  The objective of this class, Healing Hearts: A Safe Space for Grief, is to create a compassionate environment where participants can process their grief, explore different stages of grief, and discover ways to honor their loved ones through personal rituals.  3 Goals: Provide a safe and supportive space where participants feel comfortable sharing their feelings and experiences of grief without judgment. Educate participants about the stages of grief and emphasize that there is no right way to grieve or a fixed timeline for healing. Introduce the concept of rituals as a means to process grief, allowing individuals to honor their loved ones in a personal and meaningful way.  Summary:  In Healing Hearts: A Safe Space for Grief, we explored the unique and personal journey of grief, emphasizing that everyone experiences it differently. We discussed the five stages of grief (denial, anger, bargaining, depression, and acceptance), with the understanding that grief is not linear. Rituals were introduced as a way to help cope with loss, offering comfort and connection through meaningful actions such as lighting candles or taking memory walks. Participants were encouraged to express their emotions, focus on self-care, and reflect on moments of gratitude for their loved ones, recognizing that healing is a process and there is no timeline for grief.  Therapeutic Modalities: Psychoeducation:  5 Stages of Grief Elements of DBT:  mindfulness and grounding for distress tolerance   Teresa Lucero, LCSWA 05/08/2024  1:03 PM

## 2024-05-08 NOTE — Progress Notes (Signed)
   05/08/24 0810  Psych Admission Type (Psych Patients Only)  Admission Status Voluntary  Psychosocial Assessment  Patient Complaints None  Eye Contact Fair  Facial Expression Animated  Affect Appropriate to circumstance  Speech Soft  Interaction Minimal  Motor Activity Other (Comment) (WDL for pt)  Appearance/Hygiene In scrubs  Behavior Characteristics Cooperative  Mood Pleasant  Thought Process  Coherency WDL  Content WDL  Delusions None reported or observed  Perception WDL  Hallucination None reported or observed  Judgment WDL  Confusion None  Danger to Self  Current suicidal ideation? Denies  Self-Injurious Behavior No self-injurious ideation or behavior indicators observed or expressed   Agreement Not to Harm Self Yes  Description of Agreement verbal  Danger to Others  Danger to Others None reported or observed

## 2024-05-09 NOTE — Group Note (Signed)
 Recreation Therapy Group Note   Group Topic:Team Building  Group Date: 05/09/2024 Start Time: 0935 End Time: 1005 Facilitators: Kriste Broman-McCall, LRT,CTRS Location: [address] Day[address]   Group Topic: Communication, Team Building, Problem Solving  Goal Area(s) Addresses:  Patient will effectively work with peer towards shared goal.  Patient will identify skills used to make activity successful.  Patient will share challenges and verbalize solution-driven approaches used. Patient will identify how skills used during activity can be used to reach post d/c goals.   Behavioral Response: Engaged  Intervention: STEM Activity   Activity: [employer]. Patients were provided the following materials: 4 drinking straws, 5 rubber bands, 5 paper clips, 2 index cards and 2 drinking cups. Using the provided materials patients were asked to build a launching mechanism to launch a ping pong ball across the room, approximately 10 feet. Patients were divided into teams of 3-5. Instructions required all materials be incorporated into the device, functionality of items left to the peer group's discretion.  Education: Pharmacist, community, Scientist, physiological, Air cabin crew, Building control surveyor.   Education Outcome: Acknowledges education/In group clarification    Affect/Mood: Appropriate   Participation Level: Engaged   Participation Quality: Independent   Behavior: Appropriate   Speech/Thought Process: Focused   Insight: Good   Judgement: Good   Modes of Intervention: STEM Activity   Patient Response to Interventions:  Engaged   Education Outcome:  In group clarification offered    Clinical Observations/Individualized Feedback: Pt was bright and engaged. Pt was focused and interactive with peers in creating their launcher. Pt was very vocal in how to put the launcher together and made suggestions as well.     Plan: Continue to engage patient in RT group sessions 2-3x/week.   Tequisha Maahs-McCall, LRT,CTRS 05/09/2024 12:17 PM

## 2024-05-09 NOTE — Group Note (Signed)
 Date:  05/09/2024 Time:  9:22 PM  Group Topic/Focus:  Wrap-Up Group:   The focus of this group is to help patients review their daily goal of treatment and discuss progress on daily workbooks.    Additional Comments:  Pt attended the AA meeting without disruption.  Teresa Lucero 05/09/2024, 9:22 PM

## 2024-05-09 NOTE — Progress Notes (Signed)
 CSW attempted to call patient's boyfriend for SPE completion but did not get an answer. Left voicemail asking to call back.  Feliz Herard, LCSWA 05/09/24 4:30PM

## 2024-05-09 NOTE — Group Note (Signed)
 Date:  05/09/2024 Time:  4:01 PM  Group Topic/Focus:  Medication Side effect    Participation Level:  Active  Participation Quality:  Appropriate  Affect:  Appropriate  Cognitive:  Alert and Appropriate  Insight: Appropriate  Engagement in Group:  Engaged  Modes of Intervention:  Education    Teresa Lucero Lowers 05/09/2024, 4:01 PM

## 2024-05-09 NOTE — Plan of Care (Signed)
   Problem: Education: Goal: Emotional status will improve Outcome: Progressing Goal: Mental status will improve Outcome: Progressing

## 2024-05-09 NOTE — Plan of Care (Signed)
  Problem: Coping: Goal: Ability to verbalize frustrations and anger appropriately will improve Outcome: Progressing Goal: Ability to demonstrate self-control will improve Outcome: Progressing   Problem: Health Behavior/Discharge Planning: Goal: Identification of resources available to assist in meeting health care needs will improve Outcome: Progressing Goal: Compliance with treatment plan for underlying cause of condition will improve Outcome: Progressing

## 2024-05-09 NOTE — Progress Notes (Signed)
   05/09/24 1000  Psych Admission Type (Psych Patients Only)  Admission Status Voluntary  Psychosocial Assessment  Patient Complaints None  Eye Contact Fair  Facial Expression Animated  Affect Appropriate to circumstance  Speech Soft  Interaction Minimal  Motor Activity Other (Comment)  Appearance/Hygiene In scrubs  Behavior Characteristics Cooperative  Mood Pleasant  Thought Process  Coherency WDL  Content WDL  Delusions None reported or observed  Perception WDL  Hallucination None reported or observed  Judgment WDL  Confusion None  Danger to Self  Current suicidal ideation? Denies  Danger to Others  Danger to Others None reported or observed

## 2024-05-09 NOTE — Progress Notes (Signed)
 [facility] MD Progress Note  05/09/2024 9:07 AM Teresa Lucero  MRN:  [id number]  Principal Problem: MDD (major depressive disorder), recurrent severe, without psychosis (HCC) Diagnosis: Principal Problem:   MDD (major depressive disorder), recurrent severe, without psychosis (HCC)  Reason for admission: Teresa Lucero is a 18 year old African-American female with a self reported history of anxiety and depression admitted to the [facility] following presentation to [facility].  She initially reported chest pain, which prompted a cardiac workup that was reportedly unremarkable though CBC revealed anemia. During medical evaluation, she disclosed experiencing severe depressive symptoms, high levels of stress, and suicidal ideation.  Past medical history is notable for anemia and a heart murmur.   24-Hour chart review: Patient case discussed in the interdisciplinary team meeting.  Vital signs reviewed without critical values.  PRNs of trazodone  x 1 required for insomnia.  No agitation protocol required.  Today's assessment notes:  On assessment today, the pt reports that her mood is euthymic, improved since admission, and stable. Denies feeling down, depressed, or sad.  Reports that anxiety symptoms are at manageable level. Sleep is stable. Appetite is stable. Concentration is without complaint. Energy level is adequate. Denies having any suicidal thoughts. Denies having any suicidal intent and plan. Denies having any HI. Denies having psychotic symptoms.  Denies having side effects to current psychiatric medications.  No changes in her treatment plan today, estimated date of discharge 05/10/2024.  Discussed discharge planning: How to identify the signs of impending crisis, use of internal coping strategies, reaching out to friends and family that can help navigate a crisis, and a list of mental health professionals and agencies to call. Further to follow up on her mental  health appointments and her PCP appointments.   We discussed compliance to current medication regime.      Total Time spent with patient: 45 minutes  Past Psychiatric History: Previous Psych Diagnoses: None formally diagnosed Prior inpatient treatment: None Current/prior outpatient treatment: Therapy briefly in 7th grade Prior rehab tx: None Psychotherapy tx: Interested in community therapy History of suicide: Past attempt >6 months ago History of homicide: Denies Psychiatric medication history: None; occasional melatonin gummies Psychiatric medication compliance history: N/A Neuromodulation history: None Current Psychiatrist: None Current therapist: None   Past Medical History:  Past Medical History:  Diagnosis Date   Irregular heart beat     Past Surgical History:  Procedure Laterality Date   TONSILLECTOMY     Family History:  Family History  Problem Relation Age of Onset   Migraines Maternal Aunt    Seizures Neg Hx    Autism Neg Hx    ADD / ADHD Neg Hx    Anxiety disorder Neg Hx    Depression Neg Hx    Bipolar disorder Neg Hx    Schizophrenia Neg Hx    Family Psychiatric  History: See H&P Social History:  Social History   Substance and Sexual Activity  Alcohol Use No     Social History   Substance and Sexual Activity  Drug Use No    Social History   Socioeconomic History   Marital status: Single    Spouse name: Not on file   Number of children: Not on file   Years of education: Not on file   Highest education level: Not on file  Occupational History   Not on file  Tobacco Use   Smoking status: Never   Smokeless tobacco: Never  Vaping Use   Vaping status: Never Used  Substance and Sexual Activity   Alcohol use: No   Drug use: No   Sexual activity: Not Currently  Other Topics Concern   Not on file  Social History Narrative   Patient lives with mom and dad. Siblings are older and do not live at home. She is in the 7th grade at [employer].  She does well in school. She enjoys dancing, cheer, and basketball   Social Drivers of Health   Financial Resource Strain: Not on file  Food Insecurity: No Food Insecurity (05/06/2024)   Hunger Vital Sign    Worried About Running Out of Food in the Last Year: Never true    Ran Out of Food in the Last Year: Never true  Transportation Needs: No Transportation Needs (05/06/2024)   PRAPARE - Administrator, Civil Service (Medical): No    Lack of Transportation (Non-Medical): No  Physical Activity: Not on file  Stress: Not on file  Social Connections: Unknown (12/14/2022)   Received from [facility]   Social Network    Social Network: Not on file   Additional Social History:    Sleep: Good Estimated Sleeping Duration (Last 24 Hours): 5.75-7.00 hours  Appetite:  Good  Current Medications: Current Facility-Administered Medications  Medication Dose Route Frequency Provider Last Rate Last Admin   acetaminophen  (TYLENOL ) tablet 650 mg  650 mg Oral Q6H PRN Motley-Mangrum, Jadeka A, PMHNP   650 mg at 05/06/24 2124   alum & mag hydroxide-simeth (MAALOX/MYLANTA) 200-200-20 MG/5ML suspension 30 mL  30 mL Oral Q4H PRN Motley-Mangrum, Jadeka A, PMHNP       haloperidol  (HALDOL ) tablet 5 mg  5 mg Oral TID PRN Motley-Mangrum, Jadeka A, PMHNP       And   diphenhydrAMINE  (BENADRYL ) capsule 50 mg  50 mg Oral TID PRN Motley-Mangrum, Jadeka A, PMHNP       haloperidol  lactate (HALDOL ) injection 5 mg  5 mg Intramuscular TID PRN Motley-Mangrum, Jadeka A, PMHNP       And   diphenhydrAMINE  (BENADRYL ) injection 50 mg  50 mg Intramuscular TID PRN Motley-Mangrum, Jadeka A, PMHNP       And   LORazepam  (ATIVAN ) injection 2 mg  2 mg Intramuscular TID PRN Motley-Mangrum, Jadeka A, PMHNP       haloperidol  lactate (HALDOL ) injection 10 mg  10 mg Intramuscular TID PRN Motley-Mangrum, Jadeka A, PMHNP       And   diphenhydrAMINE  (BENADRYL ) injection 50 mg  50 mg Intramuscular TID PRN Motley-Mangrum, Jadeka  A, PMHNP       And   LORazepam  (ATIVAN ) injection 2 mg  2 mg Intramuscular TID PRN Motley-Mangrum, Jadeka A, PMHNP       ferrous sulfate  tablet 325 mg  325 mg Oral Q breakfast Bennett, Christal H, NP   325 mg at 05/09/24 0837   hydrOXYzine  (ATARAX ) tablet 25 mg  25 mg Oral TID PRN Motley-Mangrum, Jadeka A, PMHNP   25 mg at 05/06/24 2125   magnesium  hydroxide (MILK OF MAGNESIA) suspension 30 mL  30 mL Oral Daily PRN Motley-Mangrum, Jadeka A, PMHNP       sertraline  (ZOLOFT ) tablet 50 mg  50 mg Oral Daily Bennett, Christal H, NP   50 mg at 05/09/24 9162   traZODone  (DESYREL ) tablet 50 mg  50 mg Oral QHS PRN Motley-Mangrum, Jadeka A, PMHNP   50 mg at 05/08/24 2123   Lab Results:  Results for orders placed or performed during the hospital encounter of 05/06/24 (from the past  48 hours)  TSH     Status: None   Collection Time: 05/07/24  6:23 PM  Result Value Ref Range   TSH 1.170 0.350 - 4.500 uIU/mL    Comment: Performed at [facility], [address] [place], [place] [address]  Lipid panel     Status: None   Collection Time: 05/08/24  6:26 AM  Result Value Ref Range   Cholesterol 129 0 - 200 mg/dL    Comment:        ATP III CLASSIFICATION:  <200     mg/dL   Desirable  799-760  mg/dL   Borderline High  >=759    mg/dL   High           Triglycerides 40 <150 mg/dL   HDL 44 >59 mg/dL   Total CHOL/HDL Ratio 2.9 RATIO   VLDL 8 0 - 40 mg/dL   LDL Cholesterol 77 0 - 99 mg/dL    Comment:        Total Cholesterol/HDL:CHD Risk Coronary Heart Disease Risk Table                     Men   Women  1/2 Average Risk   3.4   3.3  Average Risk       5.0   4.4  2 X Average Risk   9.6   7.1  3 X Average Risk  23.4   11.0        Use the calculated Patient Ratio above and the CHD Risk Table to determine the patient's CHD Risk.        ATP III CLASSIFICATION (LDL):  <100     mg/dL   Optimal  899-870  mg/dL   Near or Above                    Optimal  130-159  mg/dL   Borderline   839-810  mg/dL   High  >809     mg/dL   Very High Performed at [facility], [address] [place], [place] [address]   VITAMIN D  25 Hydroxy (Vit-D Deficiency, Fractures)     Status: Abnormal   Collection Time: 05/08/24  6:26 AM  Result Value Ref Range   Vit D, 25-Hydroxy 17.27 (L) 30 - 100 ng/mL    Comment: (NOTE) Vitamin D  deficiency has been defined by the Institute of Medicine  and an Endocrine Society practice guideline as a level of serum 25-OH  vitamin D  less than 20 ng/mL (1,2). The Endocrine Society went on to  further define vitamin D  insufficiency as a level between 21 and 29  ng/mL (2).  1. IOM (Institute of Medicine). 2010. Dietary reference intakes for  calcium and D. [place]: [employer]. 2. Holick MF, Binkley , Bischoff-Ferrari HA, et al. Evaluation,  treatment, and prevention of vitamin D  deficiency: an Endocrine  Society clinical practice guideline, JCEM. 2011 Jul; 96(7): 1911-30.  Performed at [facility], [address], [place], [place] [address]   Hemoglobin A1c     Status: None   Collection Time: 05/08/24  6:26 AM  Result Value Ref Range   Hgb A1c MFr Bld 5.3 4.8 - 5.6 %    Comment: (NOTE) Diagnosis of Diabetes The following HbA1c ranges recommended by the American Diabetes Association (ADA) may be used as an aid in the diagnosis of diabetes mellitus.  Hemoglobin  Suggested A1C NGSP%              Diagnosis  <5.7                   Non Diabetic  5.7-6.4                Pre-Diabetic  >6.4                   Diabetic  <7.0                   Glycemic control for                       adults with diabetes.     Mean Plasma Glucose 105.41 mg/dL    Comment: Performed at [facility], [address], [place], [place] [address]   Blood Alcohol level:  Lab Results  Component Value Date   [facility] <15 05/06/2024   Metabolic Disorder Labs: Lab Results  Component Value Date   HGBA1C 5.3  05/08/2024   MPG 105.41 05/08/2024   No results found for: PROLACTIN Lab Results  Component Value Date   CHOL 129 05/08/2024   TRIG 40 05/08/2024   HDL 44 05/08/2024   CHOLHDL 2.9 05/08/2024   VLDL 8 05/08/2024   LDLCALC 77 05/08/2024   Physical Findings: AIMS:  ,  ,  ,  ,  ,  ,   CIWA:    COWS:     Musculoskeletal: Strength & Muscle Tone: within normal limits Gait & Station: normal Patient leans: N/A  Psychiatric Specialty Exam:  Presentation  General Appearance:  Appropriate for Environment; Casual  Eye Contact: Good  Speech: Clear and Coherent  Speech Volume: Normal  Handedness: Right  Mood and Affect  Mood: Euthymic  Affect: Congruent  Thought Process  Thought Processes: Coherent; Goal Directed  Descriptions of Associations:Intact  Orientation:Full (Time, Place and Person)  Thought Content:Logical  History of Schizophrenia/Schizoaffective disorder:No data recorded Duration of Psychotic Symptoms:No data recorded Hallucinations:Hallucinations: None  Ideas of Reference:None  Suicidal Thoughts:Suicidal Thoughts: No  Homicidal Thoughts:Homicidal Thoughts: No  Sensorium  Memory: Immediate Good; Recent Good  Judgment: Fair  Insight: Fair  Art therapist  Concentration: Good  Attention Span: Good  Recall: Fair  Fund of Knowledge: Fair  Language: Good  Psychomotor Activity  Psychomotor Activity: Psychomotor Activity: Normal  Assets  Assets: Communication Skills; Physical Health; Resilience  Sleep  Sleep: Sleep: Good Number of Hours of Sleep: 8    Physical Exam: Physical Exam Vitals and nursing note reviewed.  Constitutional:      General: She is not in acute distress.    Appearance: She is not ill-appearing.  HENT:     Head: Normocephalic.     Right Ear: External ear normal.     Left Ear: External ear normal.     Mouth/Throat:     Mouth: Mucous membranes are moist.     Pharynx: Oropharynx is  clear.  Eyes:     Extraocular Movements: Extraocular movements intact.  Cardiovascular:     Rate and Rhythm: Normal rate.     Pulses: Normal pulses.  Pulmonary:     Effort: Pulmonary effort is normal.  Abdominal:     Comments: Deferred  Genitourinary:    Comments: Deferred  Musculoskeletal:        General: Normal range of motion.     Cervical back: Normal range of motion.  Skin:  General: Skin is warm.  Neurological:     General: No focal deficit present.     Mental Status: She is alert and oriented to person, place, and time.  Psychiatric:        Mood and Affect: Mood normal.        Behavior: Behavior normal.    Review of Systems  Constitutional:  Negative for chills and fever.  HENT:  Negative for sore throat.   Eyes:  Negative for blurred vision.  Respiratory:  Negative for cough, sputum production, shortness of breath and wheezing.   Cardiovascular:  Negative for chest pain and palpitations.  Gastrointestinal:  Negative for abdominal pain, constipation, diarrhea, heartburn, nausea and vomiting.  Genitourinary:  Negative for dysuria.  Musculoskeletal:  Negative for falls.  Skin:  Negative for itching and rash.  Neurological:  Negative for dizziness and headaches.  Endo/Heme/Allergies:        See allergy listing  Psychiatric/Behavioral:  Positive for depression. Negative for hallucinations, substance abuse and suicidal ideas. The patient is nervous/anxious. The patient does not have insomnia.    Blood pressure 121/67, pulse 75, temperature 98.7 F (37.1 C), temperature source Oral, resp. rate 16, height 5' 2 (1.575 m), weight 52.2 kg, last menstrual period 04/30/2024, SpO2 100%. Body mass index is 21.03 kg/m.  Treatment Plan Summary: Daily contact with patient to assess and evaluate symptoms and progress in treatment and Medication management Treatment Plan Summary: Daily contact with patient to assess and evaluate symptoms and progress in treatment and Medication  management   Observation Level/Precautions:  15 minute checks  Laboratory:  Reviewed admission labs: UDS positive for Tetrahydrocannabinol Ethanol < 15 Pregnancy serum -negative D-Dimer 0.67 CBC with Differential - Hemoglobin 7.3, HCT 26.5, MCV 54.5, MCH 15.0, MCHC 27.5, RDW 22.5, Platelets 503, Monocytes Absolute 1.2 otherwise unremarkable CMP - Potassium 3.4 otherwise unremarkable   New lab orders:  TSH, Hemoglobin A1c, Vitamin D , Lipid panel  Psychotherapy: Enrolled in group therapies  Medications:     # MDD # GAD - Sertraline  25 mg x 1 dose then 50 daily mg thereafter -Continue Sertraline  50 mg po daily for depression and anxiety   BH agitation Haldol  protocol (see MAR) Hydroxyzine  25 mg 3 times daily as needed, anxiety Trazodone  50 mg at bedtime as needed, sleep   Medical # Anemia  - Restart ferrous sulfate  EC tablet 325 mg daily with breakfast   Tylenol  650 mg per 6 hours as needed, mild pain Maalox 30 mL every 4 hours as needed, indigestion Milk of Magnesia 30 mL daily as needed, mild constipation      Consultations: As needed  Discharge Concerns: Safety precautions  Estimated LOS: 3 to 5 days  Other: N/A    Physician Treatment Plan for Primary Diagnosis: MDD (major depressive disorder), recurrent severe, without psychosis (HCC) Long Term Goal(s): Improvement in symptoms so as ready for discharge   Short Term Goals: Ability to identify changes in lifestyle to reduce recurrence of condition will improve   Physician Treatment Plan for Secondary Diagnosis: Principal Problem:   MDD (major depressive disorder), recurrent severe, without psychosis (HCC)   Long Term Goal(s): Improvement in symptoms so as ready for discharge   Short Term Goals: Ability to identify changes in lifestyle to reduce recurrence of condition will improve   I certify that inpatient services furnished can reasonably be expected to improve the patient's condition.     Ellouise JAYSON Azure,  FNP 05/09/2024, 9:07 AM Patient ID: Teresa Lucero, female  DOB: 2005/10/30, 18 y.o.   MRN: [id number]

## 2024-05-09 NOTE — Group Note (Signed)
 Date:  05/09/2024 Time:  9:14 AM  Group Topic/Focus:  Goals Group:   The focus of this group is to help patients establish daily goals to achieve during treatment and discuss how the patient can incorporate goal setting into their daily lives to aide in recovery.    Participation Level:  Active  Participation Quality:  Appropriate  Affect:  Appropriate  Cognitive:  Appropriate  Insight: Appropriate  Engagement in Group:  Engaged  Modes of Intervention:  Discussion  Additional Comments:  pt spoke about discharge  Nat Rummer 05/09/2024, 9:14 AM

## 2024-05-10 MED ORDER — SERTRALINE HCL 50 MG PO TABS: 50.0000 mg | ORAL_TABLET | Freq: Every day | ORAL | 0 refills | Status: AC

## 2024-05-10 MED ORDER — FERROUS SULFATE 325 (65 FE) MG PO TABS: 325.0000 mg | ORAL_TABLET | Freq: Every day | ORAL | 0 refills | Status: AC

## 2024-05-10 MED ORDER — HYDROXYZINE HCL 25 MG PO TABS: 25.0000 mg | ORAL_TABLET | Freq: Three times a day (TID) | ORAL | 0 refills | Status: AC | PRN

## 2024-05-10 NOTE — Progress Notes (Signed)
  [facility] Adult Case Management Discharge Plan :  Will you be returning to the same living situation after discharge:  Yes,  patient will return home. At discharge, do you have transportation home?: Yes,  patient's  mother to transport. Do you have the ability to pay for your medications: Yes,  patient has insurance.  Release of information consent forms completed and in the chart;  Patient's signature needed at discharge.  Patient to Follow up at:  Follow-up Information     [facility]. Go on 05/13/2024.   Why: Please go to this provider on 05/13/24 at/by 8:00 am for an assessment, to obtain therapy and medication management services. Contact information: [address],  [place] [address] Hours: Mon-Fri: 8AM to 5PM  Phone: [phone] Fax: [phone]                Next level of care provider has access to [facility] Link:no  Safety Planning and Suicide Prevention discussed: Yes,  SPE completed with mother Glen Blatchley.     Has patient been referred to the Quitline?: Patient refused referral for treatment  Patient has been referred for addiction treatment: Patient refused referral for treatment.  Hunter JONELLE Lever, LCSWA 05/10/2024, 10:07 AM

## 2024-05-10 NOTE — Progress Notes (Signed)
   05/09/24 2050  Psych Admission Type (Psych Patients Only)  Admission Status Voluntary  Psychosocial Assessment  Patient Complaints None  Eye Contact Fair  Facial Expression Animated  Affect Appropriate to circumstance  Speech Soft  Interaction Minimal  Motor Activity Other (Comment) (WNL)  Appearance/Hygiene In scrubs  Behavior Characteristics Appropriate to situation  Mood Pleasant  Thought Process  Coherency WDL  Content WDL  Delusions None reported or observed  Perception WDL  Hallucination None reported or observed  Judgment WDL  Confusion None  Danger to Self  Current suicidal ideation? Passive (Denies)  Agreement Not to Harm Self Yes  Description of Agreement Notify Staff  Danger to Others  Danger to Others None reported or observed

## 2024-05-10 NOTE — Progress Notes (Signed)
   05/09/24 2050  Psych Admission Type (Psych Patients Only)  Admission Status Voluntary  Psychosocial Assessment  Facial Expression Animated  Affect Appropriate to circumstance  Speech Soft  Interaction Minimal  Motor Activity Other (Comment) (WNL)  Appearance/Hygiene In scrubs  Behavior Characteristics Appropriate to situation  Mood Pleasant  Thought Process  Coherency WDL  Content WDL  Delusions None reported or observed  Perception WDL  Hallucination None reported or observed  Judgment WDL  Confusion None  Danger to Self  Current suicidal ideation? Passive (Denies)  Agreement Not to Harm Self Yes  Description of Agreement Notify Staff  Danger to Others  Danger to Others None reported or observed

## 2024-05-10 NOTE — BHH Suicide Risk Assessment (Signed)
 [facility] Discharge Suicide Risk Assessment   Principal Problem: MDD (major depressive disorder), recurrent severe, without psychosis (HCC) Discharge Diagnoses: Principal Problem:   MDD (major depressive disorder), recurrent severe, without psychosis (HCC)   Total Time spent with patient: 45 minutes  Demographic Factors:  Adolescent or young adult and Low socioeconomic status  Loss Factors: NA  Historical Factors: Prior suicide attempts  Risk Reduction Factors:   Employed, Living with another person, especially a relative, and Positive social support  Continued Clinical Symptoms:  Depression  Cognitive Features That Contribute To Risk:  None    Suicide Risk:  Mild:  Suicidal ideation of limited frequency, intensity, duration, and specificity.  There are no identifiable plans, no associated intent, mild dysphoria and related symptoms, good self-control (both objective and subjective assessment), few other risk factors, and identifiable protective factors, including available and accessible social support.   Follow-up Information     [facility]. Go on 05/13/2024.   Why: Please go to this provider on 05/13/24 at/by 8:00 am for an assessment, to obtain therapy and medication management services. Contact information: [address],  [place] [address] Hours: Mon-Fri: HOBSON to 5PM  Phone: [phone] Fax: [phone]                Plan Of Care/Follow-up recommendations:  See DC Summary  Oliva DELENA Salmon, DO 05/10/2024, 9:04 AM

## 2024-05-10 NOTE — BH Assessment (Signed)
(  Sleep Hours) - 7.75 (Any PRNs that were needed, meds refused, or side effects to meds)- Trazodone  50 mg and Hydroxyzine  25 mg PO (Any disturbances and when (visitation, over night)- None (Concerns raised by the patient)- None (SI/HI/AVH)- Denies
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
## 2024-05-10 NOTE — [facility] Suicide Risk Assessment (Signed)
 [facility] INPATIENT:  Family/Significant Other Suicide Prevention Education  Suicide Prevention Education:  Education Completed; Teresa Lucero (mother) [phone], has been identified by the patient as the family member/significant other with whom the patient has identified as the person(s) who will aid the patient in the event of a mental health crisis (suicidal ideations/suicide attempt).  With written consent from the patient, the family member/significant other has been provided the following suicide prevention education, prior to the and/or following the discharge of the patient.  The suicide prevention education provided includes the following: Suicide risk factors Suicide prevention and interventions National Suicide Hotline telephone number [facility] assessment telephone number [facility] Emergency Assistance 911 [facility] and/or Residential Mobile Crisis Unit telephone number  Request made of family/significant other to: Remove weapons (e.g., guns, rifles, knives), all items previously/currently identified as safety concern.   Remove drugs/medications (over-the-counter, prescriptions, illicit drugs), all items previously/currently identified as a safety concern.  The family member/significant other verbalizes understanding of the suicide prevention education information provided.  The family member/significant other agrees to remove the items of safety concern listed above. The patient's mother states she recently moved out, she and patient's aunt who lives across the hall to support her. No firearms stated to be in the home.      Teresa Lucero 05/10/2024, 9:55 AM

## 2024-05-10 NOTE — Group Note (Signed)
 Date:  05/10/2024 Time:  9:02 AM  Group Topic/Focus:  Goals Group:   The focus of this group is to help patients establish daily goals to achieve during treatment and discuss how the patient can incorporate goal setting into their daily lives to aide in recovery.    Participation Level:  Active  Participation Quality:  Appropriate  Affect:  Appropriate  Cognitive:  Appropriate  Insight: Appropriate  Engagement in Group:  Engaged  Modes of Intervention:  Discussion  Additional Comments:  NA  Abbagale Goguen A Shaneequa Bahner 05/10/2024, 9:02 AM

## 2024-05-10 NOTE — Plan of Care (Signed)
   Problem: Education: Goal: Knowledge of [place] [facility] Education information/materials will improve Outcome: Progressing Goal: Emotional status will improve Outcome: Progressing Goal: Mental status will improve Outcome: Progressing Goal: Verbalization of understanding the information provided will improve Outcome: Progressing

## 2024-05-10 NOTE — Progress Notes (Signed)
 Patient discharge home via mother. Patient verbalized understanding and she was given all of her belongings.
